# Patient Record
Sex: Female | Born: 1975 | Race: White | Hispanic: No | Marital: Married | Smoking: Never smoker
Health system: Southern US, Community
[De-identification: ages and names within clinical notes are randomized; demographics above are authoritative.]

## PROBLEM LIST (undated history)

## (undated) DIAGNOSIS — R569 Unspecified convulsions: Secondary | ICD-10-CM

## (undated) HISTORY — PX: TUBAL LIGATION: SHX77

## (undated) HISTORY — PX: CHOLECYSTECTOMY: SHX55

---

## 1996-05-21 DIAGNOSIS — R569 Unspecified convulsions: Secondary | ICD-10-CM

## 1996-05-21 HISTORY — DX: Unspecified convulsions: R56.9

## 2004-10-19 HISTORY — PX: FOOT TUMOR EXCISION: SUR566

## 2004-11-09 ENCOUNTER — Ambulatory Visit (HOSPITAL_BASED_OUTPATIENT_CLINIC_OR_DEPARTMENT_OTHER): Admission: RE | Admit: 2004-11-09 | Discharge: 2004-11-09 | Payer: Self-pay | Admitting: General Surgery

## 2004-11-09 ENCOUNTER — Ambulatory Visit (HOSPITAL_COMMUNITY): Admission: RE | Admit: 2004-11-09 | Discharge: 2004-11-09 | Payer: Self-pay | Admitting: General Surgery

## 2005-02-27 ENCOUNTER — Encounter: Admission: RE | Admit: 2005-02-27 | Discharge: 2005-02-27 | Payer: Self-pay | Admitting: Obstetrics and Gynecology

## 2005-04-17 ENCOUNTER — Inpatient Hospital Stay (HOSPITAL_COMMUNITY): Admission: AD | Admit: 2005-04-17 | Discharge: 2005-04-20 | Payer: Self-pay | Admitting: Obstetrics and Gynecology

## 2010-06-10 ENCOUNTER — Encounter: Payer: Self-pay | Admitting: Obstetrics and Gynecology

## 2010-06-12 ENCOUNTER — Other Ambulatory Visit: Payer: Self-pay | Admitting: Obstetrics and Gynecology

## 2011-03-19 LAB — OB RESULTS CONSOLE HIV ANTIBODY (ROUTINE TESTING): HIV: NONREACTIVE

## 2011-03-19 LAB — OB RESULTS CONSOLE ABO/RH: RH Type: POSITIVE

## 2011-03-19 LAB — OB RESULTS CONSOLE ANTIBODY SCREEN: Antibody Screen: NEGATIVE

## 2011-05-31 ENCOUNTER — Encounter: Payer: 59 | Attending: Obstetrics and Gynecology | Admitting: Dietician

## 2011-05-31 ENCOUNTER — Encounter: Payer: Self-pay | Admitting: Dietician

## 2011-05-31 DIAGNOSIS — Z713 Dietary counseling and surveillance: Secondary | ICD-10-CM | POA: Insufficient documentation

## 2011-05-31 DIAGNOSIS — O99810 Abnormal glucose complicating pregnancy: Secondary | ICD-10-CM | POA: Insufficient documentation

## 2011-05-31 NOTE — Patient Instructions (Signed)
Goals:  Check glucose levels per MD as instructed  Follow Gestational Diabetes Diet as instructed  Call for follow-up as needed    

## 2011-05-31 NOTE — Progress Notes (Signed)
  Patient was seen on 05/31/2011 for Gestational Diabetes self-management class at the [facility]. The following learning objectives were met by the patient during this course:   States the definition of Gestational Diabetes  States why dietary management is important in controlling blood glucose  Describes the effects each nutrient has on blood glucose levels  Demonstrates ability to create a balanced meal plan  Demonstrates carbohydrate counting   States when to check blood glucose levels  Demonstrates proper blood glucose monitoring techniques  States the effect of stress and exercise on blood glucose levels  States the importance of limiting caffeine and abstaining from alcohol and smoking  Blood glucose monitor given: One Touch Ultra Mini Meter Lot # [id number] X Exp: 09/2011 Blood glucose reading: 115 mg/dl 3 hours after lunch  Patient instructed to monitor glucose levels:Fasting and 2 hours after the first bite of each meal FBS: 60 - <90 2 hour: <120  Patient received handouts:  Nutrition Diabetes and Pregnancy  Carbohydrate Counting List  Patient will be seen for follow-up as needed.

## 2011-08-31 ENCOUNTER — Inpatient Hospital Stay (HOSPITAL_COMMUNITY): Admission: AD | Admit: 2011-08-31 | Payer: Self-pay | Source: Ambulatory Visit | Admitting: Obstetrics and Gynecology

## 2011-10-16 ENCOUNTER — Other Ambulatory Visit: Payer: Self-pay | Admitting: Obstetrics and Gynecology

## 2011-10-17 ENCOUNTER — Encounter (HOSPITAL_COMMUNITY): Payer: Self-pay | Admitting: Pharmacist

## 2011-10-18 ENCOUNTER — Encounter (HOSPITAL_COMMUNITY)
Admission: RE | Admit: 2011-10-18 | Discharge: 2011-10-18 | Disposition: A | Discharge status: Home / Self Care | Payer: 59 | Source: Ambulatory Visit | Attending: Obstetrics and Gynecology | Admitting: Obstetrics and Gynecology

## 2011-10-18 ENCOUNTER — Encounter (HOSPITAL_COMMUNITY): Payer: Self-pay

## 2011-10-18 HISTORY — DX: Unspecified convulsions: R56.9

## 2011-10-18 LAB — BASIC METABOLIC PANEL
BUN: 10 mg/dL (ref 6–23)
CO2: 24 mEq/L (ref 19–32)
Calcium: 8.3 mg/dL — ABNORMAL LOW (ref 8.4–10.5)
Creatinine, Ser: 0.79 mg/dL (ref 0.50–1.10)
GFR calc Af Amer: >90 mL/min (ref >90)
GFR calc non Af Amer: >90 mL/min (ref >90)
Glucose, Bld: 123 mg/dL — ABNORMAL HIGH (ref 70–99)
Sodium: 133 mEq/L — ABNORMAL LOW (ref 135–145)

## 2011-10-18 LAB — CBC
Hemoglobin: 11.6 g/dL — ABNORMAL LOW (ref 12.0–15.0)
MCH: 26.2 pg (ref 26.0–34.0)
MCHC: 31.8 g/dL (ref 30.0–36.0)
MCV: 82.4 fL (ref 78.0–100.0)
Platelets: 211 10*3/uL (ref 150–400)
RBC: 4.43 MIL/uL (ref 3.87–5.11)
RDW: 13.7 % (ref 11.5–15.5)
WBC: 11.3 10*3/uL — ABNORMAL HIGH (ref 4.0–10.5)

## 2011-10-18 LAB — SURGICAL PCR SCREEN
MRSA, PCR: NEGATIVE
Staphylococcus aureus: NEGATIVE

## 2011-10-18 LAB — RPR: RPR Ser Ql: NONREACTIVE

## 2011-10-18 NOTE — Patient Instructions (Addendum)
09/3118 Dilara Navarrete Shipp  10/18/2011   Your procedure is scheduled on:  5/31  Enter through the Main Entrance of [facility] at 1130 AM.  Pick up the phone at the desk and dial [phone].   Call this number if you have problems the morning of surgery: [phone]   Remember:   Do not eat food:After Midnight.  Do not drink clear liquids: after 9AM.  Take these medicines the morning of surgery with A SIP OF WATER: Dilantin   Do not wear jewelry, make-up or nail polish.  Do not wear lotions, powders, or perfumes. You may wear deodorant.  Do not shave 48 hours prior to surgery.  Do not bring valuables to the hospital.  Contacts, dentures or bridgework may not be worn into surgery.  Leave suitcase in the car. After surgery it may be brought to your room.  For patients admitted to the hospital, checkout time is 11:00 AM the day of discharge.   Patients discharged the day of surgery will not be allowed to drive home.  Name and phone number of your driver: NA  Special Instructions: CHG Shower Use Special Wash: 1/2 bottle night before surgery and 1/2 bottle morning of surgery.   Please read over the following fact sheets that you were given: MRSA Information

## 2011-10-19 ENCOUNTER — Encounter (HOSPITAL_COMMUNITY): Payer: Self-pay | Admitting: Anesthesiology

## 2011-10-19 ENCOUNTER — Inpatient Hospital Stay (HOSPITAL_COMMUNITY)
Admission: RE | Admit: 2011-10-19 | Discharge: 2011-10-22 | DRG: 765 | Disposition: A | Discharge status: Home / Self Care | Payer: 59 | Source: Ambulatory Visit | Attending: Obstetrics and Gynecology | Admitting: Obstetrics and Gynecology

## 2011-10-19 ENCOUNTER — Inpatient Hospital Stay (HOSPITAL_COMMUNITY): Payer: 59 | Admitting: Anesthesiology

## 2011-10-19 ENCOUNTER — Encounter (HOSPITAL_COMMUNITY)
Admission: RE | Disposition: A | Discharge status: Home / Self Care | Payer: Self-pay | Source: Ambulatory Visit | Attending: Obstetrics and Gynecology

## 2011-10-19 DIAGNOSIS — G40909 Epilepsy, unspecified, not intractable, without status epilepticus: Secondary | ICD-10-CM | POA: Diagnosis present

## 2011-10-19 DIAGNOSIS — O9903 Anemia complicating the puerperium: Secondary | ICD-10-CM | POA: Diagnosis not present

## 2011-10-19 DIAGNOSIS — O99814 Abnormal glucose complicating childbirth: Secondary | ICD-10-CM | POA: Diagnosis present

## 2011-10-19 DIAGNOSIS — D62 Acute posthemorrhagic anemia: Secondary | ICD-10-CM | POA: Diagnosis not present

## 2011-10-19 DIAGNOSIS — O321XX0 Maternal care for breech presentation, not applicable or unspecified: Principal | ICD-10-CM | POA: Diagnosis present

## 2011-10-19 DIAGNOSIS — O99354 Diseases of the nervous system complicating childbirth: Secondary | ICD-10-CM | POA: Diagnosis present

## 2011-10-19 DIAGNOSIS — O3660X0 Maternal care for excessive fetal growth, unspecified trimester, not applicable or unspecified: Secondary | ICD-10-CM | POA: Diagnosis present

## 2011-10-19 DIAGNOSIS — Z302 Encounter for sterilization: Secondary | ICD-10-CM

## 2011-10-19 SURGERY — Surgical Case
Anesthesia: Spinal | Laterality: Bilateral | Wound class: Clean Contaminated

## 2011-10-19 MED ORDER — MEPERIDINE HCL 25 MG/ML IJ SOLN: 6.2500 mg | INTRAMUSCULAR | Status: DC | PRN

## 2011-10-19 MED ORDER — METOCLOPRAMIDE HCL 5 MG/ML IJ SOLN: 10.0000 mg | Freq: Three times a day (TID) | INTRAMUSCULAR | Status: DC | PRN

## 2011-10-19 MED ORDER — OXYTOCIN 10 UNIT/ML IJ SOLN
INTRAMUSCULAR | Status: AC
  Filled 2011-10-19: qty 2

## 2011-10-19 MED ORDER — FOLIC ACID 1 MG PO TABS
4.0000 mg | ORAL_TABLET | Freq: Every day | ORAL | Status: DC
  Filled 2011-10-19 (×4): qty 4

## 2011-10-19 MED ORDER — IBUPROFEN 600 MG PO TABS: 600.0000 mg | ORAL_TABLET | Freq: Four times a day (QID) | ORAL | Status: DC | PRN

## 2011-10-19 MED ORDER — PRENATAL MULTIVITAMIN CH
1.0000 | ORAL_TABLET | Freq: Every day | ORAL | Status: DC
  Filled 2011-10-19 (×3): qty 1

## 2011-10-19 MED ORDER — ONDANSETRON HCL 4 MG PO TABS: 4.0000 mg | ORAL_TABLET | ORAL | Status: DC | PRN

## 2011-10-19 MED ORDER — BUPIVACAINE HCL (PF) 0.25 % IJ SOLN
INTRAMUSCULAR | Status: DC | PRN
  Administered 2011-10-19: 7 mL

## 2011-10-19 MED ORDER — SODIUM CHLORIDE 0.9 % IV SOLN: 250.0000 mL | INTRAVENOUS | Status: DC

## 2011-10-19 MED ORDER — SODIUM CHLORIDE 0.9 % IJ SOLN: 3.0000 mL | INTRAMUSCULAR | Status: DC | PRN

## 2011-10-19 MED ORDER — NALBUPHINE HCL 10 MG/ML IJ SOLN
5.0000 mg | INTRAMUSCULAR | Status: DC | PRN
  Administered 2011-10-20: 10 mg via INTRAVENOUS
  Filled 2011-10-19: qty 1

## 2011-10-19 MED ORDER — FENTANYL CITRATE 0.05 MG/ML IJ SOLN: 25.0000 ug | INTRAMUSCULAR | Status: DC | PRN

## 2011-10-19 MED ORDER — BUPIVACAINE IN DEXTROSE 0.75-8.25 % IT SOLN
INTRATHECAL | Status: DC | PRN
  Administered 2011-10-19: 1.6 mL via INTRATHECAL

## 2011-10-19 MED ORDER — EPHEDRINE SULFATE 50 MG/ML IJ SOLN
INTRAMUSCULAR | Status: DC | PRN
  Administered 2011-10-19 (×2): 10 mg via INTRAVENOUS

## 2011-10-19 MED ORDER — PHENYTOIN SODIUM EXTENDED 100 MG PO CAPS
400.0000 mg | ORAL_CAPSULE | Freq: Every day | ORAL | Status: DC
  Filled 2011-10-19 (×3): qty 4

## 2011-10-19 MED ORDER — METHYLERGONOVINE MALEATE 0.2 MG PO TABS: 0.2000 mg | ORAL_TABLET | ORAL | Status: DC | PRN

## 2011-10-19 MED ORDER — DIPHENHYDRAMINE HCL 25 MG PO CAPS
25.0000 mg | ORAL_CAPSULE | ORAL | Status: DC | PRN
  Filled 2011-10-19: qty 1

## 2011-10-19 MED ORDER — FLEET ENEMA 7-19 GM/118ML RE ENEM: 1.0000 | ENEMA | Freq: Every day | RECTAL | Status: DC | PRN

## 2011-10-19 MED ORDER — FENTANYL CITRATE 0.05 MG/ML IJ SOLN
INTRAMUSCULAR | Status: DC | PRN
  Administered 2011-10-19: 25 ug via INTRATHECAL

## 2011-10-19 MED ORDER — KETOROLAC TROMETHAMINE 30 MG/ML IJ SOLN
30.0000 mg | Freq: Four times a day (QID) | INTRAMUSCULAR | Status: DC | PRN
  Filled 2011-10-19: qty 1

## 2011-10-19 MED ORDER — LACTATED RINGERS IV SOLN
INTRAVENOUS | Status: DC
  Administered 2011-10-19 (×4): via INTRAVENOUS

## 2011-10-19 MED ORDER — MORPHINE SULFATE (PF) 0.5 MG/ML IJ SOLN
INTRAMUSCULAR | Status: DC | PRN
  Administered 2011-10-19: .15 mg via INTRATHECAL

## 2011-10-19 MED ORDER — CEFAZOLIN SODIUM-DEXTROSE 2-3 GM-% IV SOLR
2.0000 g | INTRAVENOUS | Status: AC
  Administered 2011-10-19: 2 g via INTRAVENOUS
  Filled 2011-10-19: qty 50

## 2011-10-19 MED ORDER — KETOROLAC TROMETHAMINE 30 MG/ML IJ SOLN
INTRAMUSCULAR | Status: AC
  Filled 2011-10-19: qty 1

## 2011-10-19 MED ORDER — SODIUM CHLORIDE 0.9 % IV SOLN: 1.0000 ug/kg/h | INTRAVENOUS | Status: DC | PRN

## 2011-10-19 MED ORDER — OXYCODONE-ACETAMINOPHEN 5-325 MG PO TABS
1.0000 | ORAL_TABLET | ORAL | Status: DC | PRN
  Administered 2011-10-20 – 2011-10-21 (×3): 1 via ORAL
  Filled 2011-10-19 (×3): qty 1

## 2011-10-19 MED ORDER — MEASLES, MUMPS & RUBELLA VAC ~~LOC~~ INJ: 0.5000 mL | INJECTION | Freq: Once | SUBCUTANEOUS | Status: DC

## 2011-10-19 MED ORDER — 0.9 % SODIUM CHLORIDE (POUR BTL) OPTIME
TOPICAL | Status: DC | PRN
  Administered 2011-10-19: 1000 mL

## 2011-10-19 MED ORDER — NALBUPHINE HCL 10 MG/ML IJ SOLN: 5.0000 mg | INTRAMUSCULAR | Status: DC | PRN

## 2011-10-19 MED ORDER — MORPHINE SULFATE 0.5 MG/ML IJ SOLN
INTRAMUSCULAR | Status: AC
  Filled 2011-10-19: qty 10

## 2011-10-19 MED ORDER — KETOROLAC TROMETHAMINE 30 MG/ML IJ SOLN
30.0000 mg | Freq: Four times a day (QID) | INTRAMUSCULAR | Status: DC | PRN
  Administered 2011-10-19 (×2): 30 mg via INTRAVENOUS

## 2011-10-19 MED ORDER — DIPHENHYDRAMINE HCL 50 MG/ML IJ SOLN: 25.0000 mg | INTRAMUSCULAR | Status: DC | PRN

## 2011-10-19 MED ORDER — MENTHOL 3 MG MT LOZG: 1.0000 | LOZENGE | OROMUCOSAL | Status: DC | PRN

## 2011-10-19 MED ORDER — ONDANSETRON HCL 4 MG/2ML IJ SOLN: 4.0000 mg | INTRAMUSCULAR | Status: DC | PRN

## 2011-10-19 MED ORDER — DIBUCAINE 1 % RE OINT: 1.0000 "application " | TOPICAL_OINTMENT | RECTAL | Status: DC | PRN

## 2011-10-19 MED ORDER — FENTANYL CITRATE 0.05 MG/ML IJ SOLN
INTRAMUSCULAR | Status: AC
  Filled 2011-10-19: qty 2

## 2011-10-19 MED ORDER — SODIUM CHLORIDE 0.9 % IJ SOLN: 3.0000 mL | Freq: Two times a day (BID) | INTRAMUSCULAR | Status: DC

## 2011-10-19 MED ORDER — OXYTOCIN 10 UNIT/ML IJ SOLN
40.0000 [IU] | INTRAVENOUS | Status: DC | PRN
  Administered 2011-10-19: 40 [IU] via INTRAVENOUS

## 2011-10-19 MED ORDER — SIMETHICONE 80 MG PO CHEW
80.0000 mg | CHEWABLE_TABLET | Freq: Three times a day (TID) | ORAL | Status: DC
  Administered 2011-10-19 – 2011-10-22 (×10): 80 mg via ORAL

## 2011-10-19 MED ORDER — SCOPOLAMINE 1 MG/3DAYS TD PT72
1.0000 | MEDICATED_PATCH | TRANSDERMAL | Status: DC
  Administered 2011-10-19: 1.5 mg via TRANSDERMAL

## 2011-10-19 MED ORDER — LANOLIN HYDROUS EX OINT: 1.0000 "application " | TOPICAL_OINTMENT | CUTANEOUS | Status: DC | PRN

## 2011-10-19 MED ORDER — WITCH HAZEL-GLYCERIN EX PADS: 1.0000 "application " | MEDICATED_PAD | CUTANEOUS | Status: DC | PRN

## 2011-10-19 MED ORDER — MEDROXYPROGESTERONE ACETATE 150 MG/ML IM SUSP: 150.0000 mg | INTRAMUSCULAR | Status: DC | PRN

## 2011-10-19 MED ORDER — EPHEDRINE 5 MG/ML INJ
INTRAVENOUS | Status: AC
  Filled 2011-10-19: qty 10

## 2011-10-19 MED ORDER — NALOXONE HCL 0.4 MG/ML IJ SOLN: 0.4000 mg | INTRAMUSCULAR | Status: DC | PRN

## 2011-10-19 MED ORDER — BUPIVACAINE HCL (PF) 0.25 % IJ SOLN
INTRAMUSCULAR | Status: AC
  Filled 2011-10-19: qty 30

## 2011-10-19 MED ORDER — TETANUS-DIPHTH-ACELL PERTUSSIS 5-2.5-18.5 LF-MCG/0.5 IM SUSP: 0.5000 mL | Freq: Once | INTRAMUSCULAR | Status: DC

## 2011-10-19 MED ORDER — IBUPROFEN 600 MG PO TABS
600.0000 mg | ORAL_TABLET | Freq: Four times a day (QID) | ORAL | Status: DC
  Administered 2011-10-20 (×2): 600 mg via ORAL
  Filled 2011-10-19 (×2): qty 1

## 2011-10-19 MED ORDER — ONDANSETRON HCL 4 MG/2ML IJ SOLN
INTRAMUSCULAR | Status: AC
  Filled 2011-10-19: qty 2

## 2011-10-19 MED ORDER — SENNOSIDES-DOCUSATE SODIUM 8.6-50 MG PO TABS
2.0000 | ORAL_TABLET | Freq: Every day | ORAL | Status: DC
  Administered 2011-10-20 – 2011-10-21 (×2): 2 via ORAL

## 2011-10-19 MED ORDER — METHYLERGONOVINE MALEATE 0.2 MG/ML IJ SOLN: 0.2000 mg | INTRAMUSCULAR | Status: DC | PRN

## 2011-10-19 MED ORDER — CLINDAMYCIN PHOSPHATE 1 % EX GEL: Freq: Two times a day (BID) | CUTANEOUS | Status: DC

## 2011-10-19 MED ORDER — OXYTOCIN 20 UNITS IN LACTATED RINGERS INFUSION - SIMPLE
INTRAVENOUS | Status: AC
  Administered 2011-10-19: 125 mL/h via INTRAVENOUS
  Filled 2011-10-19: qty 1000

## 2011-10-19 MED ORDER — SCOPOLAMINE 1 MG/3DAYS TD PT72
MEDICATED_PATCH | TRANSDERMAL | Status: AC
  Administered 2011-10-19: 1.5 mg via TRANSDERMAL
  Filled 2011-10-19: qty 1

## 2011-10-19 MED ORDER — DIPHENHYDRAMINE HCL 25 MG PO CAPS
25.0000 mg | ORAL_CAPSULE | Freq: Four times a day (QID) | ORAL | Status: DC | PRN
  Administered 2011-10-19: 25 mg via ORAL

## 2011-10-19 MED ORDER — CEFAZOLIN SODIUM 1-5 GM-% IV SOLN
1.0000 g | Freq: Three times a day (TID) | INTRAVENOUS | Status: AC
  Administered 2011-10-19 – 2011-10-20 (×2): 1 g via INTRAVENOUS
  Filled 2011-10-19 (×2): qty 50

## 2011-10-19 MED ORDER — SIMETHICONE 80 MG PO CHEW: 80.0000 mg | CHEWABLE_TABLET | ORAL | Status: DC | PRN

## 2011-10-19 MED ORDER — BISACODYL 10 MG RE SUPP: 10.0000 mg | Freq: Every day | RECTAL | Status: DC | PRN

## 2011-10-19 MED ORDER — ONDANSETRON HCL 4 MG/2ML IJ SOLN: 4.0000 mg | Freq: Three times a day (TID) | INTRAMUSCULAR | Status: DC | PRN

## 2011-10-19 MED ORDER — OXYTOCIN 20 UNITS IN LACTATED RINGERS INFUSION - SIMPLE
125.0000 mL/h | INTRAVENOUS | Status: AC
  Administered 2011-10-19 (×2): 125 mL/h via INTRAVENOUS
  Filled 2011-10-19: qty 1000

## 2011-10-19 MED ORDER — DIPHENHYDRAMINE HCL 50 MG/ML IJ SOLN: 12.5000 mg | INTRAMUSCULAR | Status: DC | PRN

## 2011-10-19 MED ORDER — ZOLPIDEM TARTRATE 5 MG PO TABS: 5.0000 mg | ORAL_TABLET | Freq: Every evening | ORAL | Status: DC | PRN

## 2011-10-19 SURGICAL SUPPLY — 39 items
BENZOIN TINCTURE PRP APPL 2/3 (GAUZE/BANDAGES/DRESSINGS) IMPLANT
CLOTH BEACON ORANGE TIMEOUT ST (SAFETY) ×2 IMPLANT
CONTAINER PREFILL 10% NBF 15ML (MISCELLANEOUS) ×4 IMPLANT
DRESSING TELFA 8X3 (GAUZE/BANDAGES/DRESSINGS) ×2 IMPLANT
ELECT REM PT RETURN 9FT ADLT (ELECTROSURGICAL) ×2
ELECTRODE REM PT RTRN 9FT ADLT (ELECTROSURGICAL) ×1 IMPLANT
EXTRACTOR VACUUM KIWI (MISCELLANEOUS) IMPLANT
EXTRACTOR VACUUM M CUP 4 TUBE (SUCTIONS) IMPLANT
GAUZE SPONGE 4X4 12PLY STRL LF (GAUZE/BANDAGES/DRESSINGS) ×4 IMPLANT
GLOVE BIO SURGEON STRL SZ 6.5 (GLOVE) ×2 IMPLANT
GLOVE BIOGEL PI IND STRL 7.0 (GLOVE) ×2 IMPLANT
GLOVE BIOGEL PI INDICATOR 7.0 (GLOVE) ×2
GOWN PREVENTION PLUS LG XLONG (DISPOSABLE) ×6 IMPLANT
KIT ABG SYR 3ML LUER SLIP (SYRINGE) IMPLANT
NEEDLE HYPO 25X1 1.5 SAFETY (NEEDLE) ×2 IMPLANT
NEEDLE HYPO 25X5/8 SAFETYGLIDE (NEEDLE) IMPLANT
NS IRRIG 1000ML POUR BTL (IV SOLUTION) ×2 IMPLANT
PACK C SECTION WH (CUSTOM PROCEDURE TRAY) ×2 IMPLANT
PAD ABD 7.5X8 STRL (GAUZE/BANDAGES/DRESSINGS) ×2 IMPLANT
RTRCTR C-SECT PINK 25CM LRG (MISCELLANEOUS) IMPLANT
SLEEVE SCD COMPRESS KNEE MED (MISCELLANEOUS) IMPLANT
STAPLER VISISTAT 35W (STAPLE) IMPLANT
STRIP CLOSURE SKIN 1/2X4 (GAUZE/BANDAGES/DRESSINGS) IMPLANT
SUT CHROMIC GUT AB #0 18 (SUTURE) ×2 IMPLANT
SUT MNCRL 0 VIOLET CTX 36 (SUTURE) ×3 IMPLANT
SUT MON AB 4-0 PS1 27 (SUTURE) IMPLANT
SUT MONOCRYL 0 CTX 36 (SUTURE) ×3
SUT PLAIN 2 0 (SUTURE)
SUT PLAIN ABS 2-0 CT1 27XMFL (SUTURE) IMPLANT
SUT VIC AB 0 CT1 27 (SUTURE) ×2
SUT VIC AB 0 CT1 27XBRD ANBCTR (SUTURE) ×2 IMPLANT
SUT VIC AB 2-0 CT1 27 (SUTURE) ×1
SUT VIC AB 2-0 CT1 TAPERPNT 27 (SUTURE) ×1 IMPLANT
SUT VICRYL 0 TIES 12 18 (SUTURE) IMPLANT
SYR CONTROL 10ML LL (SYRINGE) ×2 IMPLANT
TAPE CLOTH SURG 4X10 WHT LF (GAUZE/BANDAGES/DRESSINGS) ×2 IMPLANT
TOWEL OR 17X24 6PK STRL BLUE (TOWEL DISPOSABLE) ×4 IMPLANT
TRAY FOLEY CATH 14FR (SET/KITS/TRAYS/PACK) ×2 IMPLANT
WATER STERILE IRR 1000ML POUR (IV SOLUTION) ×2 IMPLANT

## 2011-10-19 NOTE — Anesthesia Postprocedure Evaluation (Signed)
Anesthesia Post Note  Patient: Isabella Mcintyre  Procedure(s) Performed: Procedure(s) (LRB): CESAREAN SECTION WITH BILATERAL TUBAL LIGATION (Bilateral)  Anesthesia type: Spinal  Patient location: PACU  Post pain: Pain level controlled  Post assessment: Post-op Vital signs reviewed  Last Vitals:  Filed Vitals:   10/19/11 1430  BP: 118/73  Pulse: 75  Temp:   Resp: 18    Post vital signs: Reviewed  Level of consciousness: awake  Complications: No apparent anesthesia complications

## 2011-10-19 NOTE — Brief Op Note (Signed)
10/19/2011  2:09 PM  PATIENT:  Isabella Mcintyre  36 y.o. female  PRE-OPERATIVE DIAGNOSIS:  Breech, Macrosomia, Term gestation, Class A1 GDM, Seizure disorder, Desires sterilization  POST-OPERATIVE DIAGNOSIS:  Homero Fellers Breech, Term gestation Macrosomia, Class A1  GDM, Seizure disorder, Desires sterilization  PROCEDURE:  Procedure(s) (LRB):PRIMARY CESAREAN SECTION, KERR HYSTEROTOMY BILATERAL MODIFIED TUBAL LIGATION (Bilateral)  SURGEON:  Surgeon(s) and Role:    * Amar Sippel Cathie Beams, MD - Primary  PHYSICIAN ASSISTANT:   ASSISTANTS: Marlinda Mike, CNM  ANESTHESIA:   spinal  FINDINGS: live female frank breech, nl tubes and ovaries, 9lb 4 oz  EBL:  Total I/O In: 2600 [I.V.:2600] Out: 850 [Urine:150; Blood:700]  BLOOD ADMINISTERED:none  DRAINS: none   LOCAL MEDICATIONS USED:  MARCAINE     SPECIMEN:  Source of Specimen:  placenta, portion of right and left tube  DISPOSITION OF SPECIMEN:  PATHOLOGY  COUNTS:  YES  TOURNIQUET:  * No tourniquets in log *  DICTATION: .Other Dictation: Dictation Number [id number]  PLAN OF CARE: Admit to inpatient   PATIENT DISPOSITION:  PACU - hemodynamically stable.   Delay start of Pharmacological VTE agent (>24hrs) due to surgical blood loss or risk of bleeding: no

## 2011-10-19 NOTE — Anesthesia Procedure Notes (Signed)
Spinal  Patient location during procedure: OR Start time: 10/19/2011 1:22 PM Staffing Anesthesiologist: Jerrian Mells A. Performed by: anesthesiologist  Preanesthetic Checklist Completed: patient identified, site marked, surgical consent, pre-op evaluation, timeout performed, IV checked, risks and benefits discussed and monitors and equipment checked Spinal Block Patient position: sitting Prep: site prepped and draped and DuraPrep Patient monitoring: heart rate, cardiac monitor, blood pressure and continuous pulse ox Approach: midline Location: L3-4 Injection technique: single-shot Needle Needle type: Sprotte  Needle gauge: 24 G Needle length: 9 cm Needle insertion depth: 5 cm Assessment Sensory level: T4 Additional Notes Patient tolerated procedure well. Adequate sensory level.

## 2011-10-19 NOTE — OR Nursing (Signed)
10/19/2010 History and physical on paper on the chart per Dr. Cherly Hensen.

## 2011-10-19 NOTE — Transfer of Care (Signed)
Immediate Anesthesia Transfer of Care Note  Patient: Isabella Mcintyre  Procedure(s) Performed: Procedure(s) (LRB): CESAREAN SECTION WITH BILATERAL TUBAL LIGATION (Bilateral)  Patient Location: PACU  Anesthesia Type: Spinal  Level of Consciousness: awake, alert  and oriented  Airway & Oxygen Therapy: Patient Spontanous Breathing  Post-op Assessment: Report given to PACU RN and Post -op Vital signs reviewed and stable  Post vital signs: Reviewed and stable  Complications: No apparent anesthesia complications

## 2011-10-19 NOTE — Op Note (Signed)
Isabella Mcintyre, Isabella Mcintyre             ACCOUNT NO.:  [id number]  MEDICAL RECORD NO.:  [id number]  LOCATION:  9103                          FACILITY:  [facility]  PHYSICIAN:  Maxie Better, M.D.DATE OF BIRTH:  1976/04/02  DATE OF PROCEDURE:  10/19/2011 DATE OF DISCHARGE:                              OPERATIVE REPORT   PREOPERATIVE DIAGNOSES:  Term gestation, class A1 gestational diabetes, frank breech presentation, desires sterilization, seizure disorder.  POSTOPERATIVE DIAGNOSES:  Fetal macrosomia, class A1 gestational diabetes, term gestation, seizure disorder, frank breech presentation,desires sterilization.  PROCEDURE:  Primary cesarean section, Sharl Ma hysterotomy  Modified Pomeroy tubal ligation.  ANESTHESIA:  Spinal.  SURGEON:  Maxie Better, MD  ASSISTANT:  Marlinda Mike, CNM  PROCEDURE:  Under adequate spinal anesthesia, the patient was placed in the supine position with a left lateral tilt.  She was sterilely prepped and draped in usual fashion.  An indwelling Foley catheter was sterilely placed.  Marcaine 0.25% was injected along the planned Pfannenstiel skin incision site.  Pfannenstiel skin incision was then made, carried down to the rectus fascia.  The rectus fascia was opened transversely.  The rectus fascia was then bluntly and sharply dissected off the rectus muscle in superior and inferior fashion.  The rectus muscle was split in midline.  The parietal peritoneum was entered sharply and extended.  A well-developed lower uterine segment was noted.  The vesicouterine peritoneum was opened transversely.  The bladder was bluntly dissected off the lower uterine segment and then displaced inferiorly with a bladder retractor.  A curvilinear low-transverse uterine incision was then made and extended with bandage scissors with subsequent artificial rupture of membranes, clear amniotic fluid noted.  Subsequent delivery of a live female from a frank breech presentation was  accomplished with the usual breech maneuvers.  The baby was bulb suctioned on the abdomen. Cord was clamped and cut.  The baby was transferred to the awaiting pediatrician who assigned Apgars of 9 and 9 at 1 and 5 minutes.  The placenta was manually removed.  Uterine cavity was cleaned of debris. Uterine incision had no extension.  Uterine incision was closed in 2 layers, the first layer with 0 Monocryl running locked stitch, second layer was imbricating using 0 Monocryl suture.  Good hemostasis noted along the incision line.  The abdomen was then copiously irrigated and suctioned of debris.  Both fallopian tubes were noted to be normal as well as the ovaries.  The midportion of the left fallopian tube was grasped with a Babcock.  The underlying mesosalpinx was opened with cautery.  Proximal and distal portion of the tube was tied with 0 chromic sutures proximally and distally with the intervening segment of tube was then removed.  The same procedure was performed on the contralateral side after identifying the fimbriated end.  The midportion of the right fallopian tube was also removed.  With good hemostasis noted, the parietal peritoneum was then closed with 2-0 Vicryl, the rectus fascia was closed with 0 Vicryl x2, the subcutaneous area was irrigated, small bleeder was cauterized, interrupted 2-0 plain suture was placed and the skin was approximated using Ethicon staples. Specimen was placenta sent to pathology due to the maternal history of melanoma,  portion of right and left fallopian tubes sent to pathology. Estimated blood loss was 700 mL.  Intraoperative fluid 2600 mL. Urine output 150 mL, clear yellow urine.  Sponge and instrument counts x2 was correct.  Complication was none.  Weight of the baby was 9 pounds and 4 ounces.  The patient tolerated the procedure well, was transferred to recovery room in stable condition.     Maxie Better, M.D.     Bagnell/MEDQ  D:   10/19/2011  T:  10/19/2011  Job:  [id number]

## 2011-10-19 NOTE — Consult Note (Signed)
Neonatology Note:   Attendance at C-section:    I was asked to attend this primary C/S at term due to breech presentation. The mother is a G2P1 O pos, GBS unknown with an otherwise uncomplicated pregnancy. ROM at delivery, fluid clear. Infant vigorous with good spontaneous cry and tone. Needed only minimal bulb suctioning. Ap 9/9. Lungs clear to ausc in DR. Very superficial 0.5 cm laceration on left buttock, shown to father. To CN to care of Pediatrician.   Deatra James, MD

## 2011-10-19 NOTE — Anesthesia Preprocedure Evaluation (Signed)
Anesthesia Evaluation  Patient identified by MRN, date of birth, ID band Patient awake    Reviewed: Allergy & Precautions, H&P , NPO status , Patient's Chart, lab work & pertinent test results  Airway Mallampati: III TM Distance: >3 FB Neck ROM: Full    Dental No notable dental hx. (+) Teeth Intact   Pulmonary neg pulmonary ROS,  breath sounds clear to auscultation  Pulmonary exam normal       Cardiovascular negative cardio ROS  Rhythm:Regular Rate:Normal     Neuro/Psych Seizures -, Well Controlled,  On Dilantin last dose this am. Last Seizure 10 years ago negative psych ROS   GI/Hepatic negative GI ROS, Neg liver ROS,   Endo/Other  Diabetes mellitus-, Well Controlled, Gestational  Renal/GU negative Renal ROS  negative genitourinary   Musculoskeletal negative musculoskeletal ROS (+)   Abdominal   Peds  Hematology negative hematology ROS (+)   Anesthesia Other Findings   Reproductive/Obstetrics (+) Pregnancy                           Anesthesia Physical Anesthesia Plan  ASA: II  Anesthesia Plan: Spinal   Post-op Pain Management:    Induction:   Airway Management Planned: Natural Airway  Additional Equipment:   Intra-op Plan:   Post-operative Plan:   Informed Consent: I have reviewed the patients History and Physical, chart, labs and discussed the procedure including the risks, benefits and alternatives for the proposed anesthesia with the patient or authorized representative who has indicated his/her understanding and acceptance.   Dental advisory given  Plan Discussed with: CRNA, Anesthesiologist and Surgeon  Anesthesia Plan Comments:         Anesthesia Quick Evaluation

## 2011-10-20 ENCOUNTER — Encounter (HOSPITAL_COMMUNITY): Payer: Self-pay | Admitting: *Deleted

## 2011-10-20 LAB — CBC
HCT: 26.5 % — ABNORMAL LOW (ref 36.0–46.0)
Hemoglobin: 8.8 g/dL — ABNORMAL LOW (ref 12.0–15.0)
MCH: 26.8 pg (ref 26.0–34.0)
MCHC: 33.2 g/dL (ref 30.0–36.0)

## 2011-10-20 MED ORDER — IBUPROFEN 800 MG PO TABS
800.0000 mg | ORAL_TABLET | Freq: Three times a day (TID) | ORAL | Status: DC
Start: 2011-10-20 — End: 2011-10-22
  Administered 2011-10-20 – 2011-10-22 (×6): 800 mg via ORAL
  Filled 2011-10-20 (×6): qty 1

## 2011-10-20 NOTE — Anesthesia Postprocedure Evaluation (Signed)
  Anesthesia Post-op Note  Patient: Isabella Mcintyre  Procedure(s) Performed: Procedure(s) (LRB): CESAREAN SECTION WITH BILATERAL TUBAL LIGATION (Bilateral)  Patient Location: PACU and Mother/Baby  Anesthesia Type: Epidural  Level of Consciousness: awake, alert  and oriented  Airway and Oxygen Therapy: Patient Spontanous Breathing  Post-op Pain: none  Post-op Assessment: Post-op Vital signs reviewed and Patient's Cardiovascular Status Stable  Post-op Vital Signs: Reviewed and stable  Complications: No apparent anesthesia complications

## 2011-10-20 NOTE — Progress Notes (Signed)
Patient ID: Isabella Mcintyre, female   DOB: 12-Nov-1975, 36 y.o.   MRN: [id number]  POSTOPERATIVE DAY # 1 S/P cesarean section and BTL   S:         Reports feeling some soreness - more right side / motrin minimally effective             Tolerating po intake / no nausea / no vomiting / + flatus / no BM             Bleeding is light             Pain controlled with motrin and percocet             Up ad lib / ambulatory  Newborn breast feeding  / female newborn   O:  A & O x 3 NAD              VS: Blood pressure 117/70, pulse 70, temperature 97.8 F (36.6 C), temperature source Oral, resp. rate 18, weight 83.462 kg (184 lb), last menstrual period 01/19/2011, SpO2 95.00%, unknown if currently breastfeeding.   LABS: WBC/Hgb/Hct/Plts:  10.9/8.8/26.5/154 (06/01 0545)    I&O:  + 2300~  Lungs: Clear and unlabored  Heart: regular rate and rhythm / no mumurs  Abdomen: soft, non-tender, non-distended, active BS             Fundus: firm, non-tender, Ueven             Dressing intact without drainage - edge lifted to assess incision site              Incision:  approximated with staples / no erythema / no ecchymosis / no drainage  Perineum: intact / no edema  Lochia: light  Extremities: trace edema, no calf pain or tenderness  A:        POD # 1 S/P CS and BTL            Acute blood loss anemia  P:        Routine postoperative care              Start iron and colace            Adjust motrin dose for better pain control     Marlinda Mike CNM,MSN 10/20/2011, 11:05 AM

## 2011-10-20 NOTE — Addendum Note (Signed)
Addendum  created 10/20/11 1051 by Orlie Pollen, CRNA   Modules edited:Notes Section

## 2011-10-21 NOTE — Progress Notes (Signed)
POSTOPERATIVE DAY # 2 S/P cesarean section   S:         Reports feeling well - better today             Tolerating po intake / no  nausea / no  vomiting / + flatus / no BM             Bleeding is spotting             Pain controlled with motrin and percocet             Up ad lib / ambulatory  Newborn breast feeding     O:  A & O x 3              VS: Blood pressure 122/63, pulse 80, temperature 98.3 F (36.8 C), temperature source Oral, resp. rate 20, weight 83.462 kg (184 lb), last menstrual period 01/19/2011, SpO2 95.00%, unknown if currently breastfeeding.  Lungs: Clear and unlabored  Heart: regular rate and rhythm  Abdomen: soft, non-tender, non-distended, active BS             Fundus: firm, non-tender, U-2             Dressing OFF              Incision:  approximated with staples /  No erythema / no ecchymosis / no drainage  Perineum: no edema  Lochia: spotting / scant amount  Extremities: no edema, no calf pain or tenderness, negative Homans  A:        POD # 2 S/P cesarean section            stable status  P:        Routine postoperative care              Discharge tomorrow     Marlinda Mike CNM,MSN 10/21/2011, 10:17 AM

## 2011-10-22 ENCOUNTER — Encounter (HOSPITAL_COMMUNITY): Payer: Self-pay | Admitting: *Deleted

## 2011-10-22 MED ORDER — POLYSACCHARIDE IRON COMPLEX 150 MG PO CAPS: 150.0000 mg | ORAL_CAPSULE | Freq: Every day | ORAL | Status: DC

## 2011-10-22 MED ORDER — POLYSACCHARIDE IRON COMPLEX 150 MG PO CAPS
150.0000 mg | ORAL_CAPSULE | Freq: Every day | ORAL | Status: DC
  Filled 2011-10-22: qty 1

## 2011-10-22 MED ORDER — HYDROCHLOROTHIAZIDE 12.5 MG PO CAPS: 12.5000 mg | ORAL_CAPSULE | Freq: Every day | ORAL | Status: DC

## 2011-10-22 MED ORDER — OXYCODONE-ACETAMINOPHEN 5-325 MG PO TABS: 1.0000 | ORAL_TABLET | ORAL | Status: AC | PRN

## 2011-10-22 MED ORDER — IBUPROFEN 800 MG PO TABS: 800.0000 mg | ORAL_TABLET | Freq: Three times a day (TID) | ORAL | Status: AC

## 2011-10-22 NOTE — Progress Notes (Signed)
Patient ID: Isabella Mcintyre, female   DOB: [birth year], 36 y.o.   MRN: [id number]   POSTOPERATIVE DAY # 3 S/P cesarean section / BTL   S:         Reports feeling well / ready for discharge             Tolerating po intake / no  nausea / no  vomiting / + flatus / no  BM             Bleeding is light             Pain controlled with motrin and percocet             Up ad lib / ambulatory  Newborn bottle feeding  / female newborn   O:  A & O x 3              VS: Blood pressure 129/81, pulse 76, temperature 98.1 F (36.7 C), temperature source Oral, resp. rate 18, weight 83.462 kg (184 lb), last menstrual period 01/19/2011, SpO2 95.00%, unknown if currently breastfeeding.  Lungs: Clear and unlabored  Heart: regular rate and rhythm  Abdomen: soft, non-tender, non-distended, active BS             Fundus: firm, non-tender, U-1             Dressing OFF              Incision:  approximated with staples / no erythema / no ecchymosis / no drainage  Perineum: no edema  Lochia: light  Extremities: 2+ pedal edema, no calf pain or tenderness  A:        POD # 3 S/P cesarean section with BTL            Dependent edema  P:        Routine postoperative care              Discharge home             HCTZ x 5 days     Marlinda Mike CNM,MSN 10/22/2011, 8:57 AM

## 2011-10-22 NOTE — Discharge Summary (Signed)
POSTOPERATIVE DISCHARGE SUMMARY:  Patient ID: Isabella Mcintyre MRN: [id number] DOB/AGE: 10-13-1975 36 y.o.  Admit date: 10/19/2011 Discharge date:  10/22/2011  Admission Diagnoses: 39 weeks / breech / undesired fertility/class A1 GDM/ seizure disorder/fetal macrosomia    Discharge Diagnoses:  Homero Fellers breech/Class A1GDM/ seizure disorder/desires sterilization/fetal macrosomia  Term Pregnancy-delivered  POD s/p  Primary cesarean section and BTL  Prenatal history: G2P1002   EDC : 10/26/2011, by Last Menstrual Period  Prenatal care at [facility]  Primary provider : Laverta Harnisch Prenatal course complicated by breech, class A1 GDM  Prenatal Labs: ABO, Rh: O (10/29 1416) positive Antibody: Negative (10/29 1416) Rubella: Immune (10/29 1416)  RPR: NON REACTIVE (05/30 1410)  HBsAg: Negative (10/29 1416)  HIV: Non-reactive (10/29 1416)  1 hr Glucola : nl   Medical / Surgical History :  Past medical history:  Past Medical History  Diagnosis Date  . Seizures 1998    last seizure, one of only two in 1998  . Diabetes mellitus     GDM  diet control    Past surgical history:  Past Surgical History  Procedure Date  . Cholecystectomy   . 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  Social History:  reports that she has never smoked. She has never used smokeless tobacco. She reports that she does not drink alcohol or use illicit drugs.   Allergies: Review of patient's allergies indicates no known allergies.    Current Medications at time of admission:  Prior to Admission medications   Medication Sig Start Date End Date Taking? Authorizing Provider  butalbital-acetaminophen-caffeine (FIORICET, ESGIC) 50-325-40 MG per tablet Take 1-2 tablets by mouth every 4 (four) hours as needed. For headache   Yes Historical Provider, MD  clindamycin (CLINDAGEL) 1 % gel Apply topically 2 (two) times daily. For facial acne.    Yes Historical Provider, MD  folic acid (FOLVITE) 1 MG tablet Take 4 mg by mouth daily.   Yes Historical Provider, MD  guaiFENesin (MUCINEX) 600 MG 12 hr tablet Take 600 mg by mouth 2 (two) times daily. For congestion   Yes Historical Provider, MD  phenytoin (DILANTIN) 200 MG ER capsule Take 400 mg by mouth daily.   Yes Historical Provider, MD  Prenatal Vitamins (DIS) TABS Take 1 tablet by mouth daily.   Yes Historical Provider, MD   Procedures: Cesarean section delivery of female newborn by Dr Cherly Hensen  See operative report for further details  Postoperative / postpartum course: uneventful  Physical Exam:   VSS: Blood pressure 129/81, pulse 76, temperature 98.1 F (36.7 C), temperature source Oral, resp. rate 18, weight 83.462 kg (184 lb), last menstrual period 01/19/2011, SpO2 95.00%, unknown if currently breastfeeding.  LABS:   preop hgb 11.6 / postop hgb  8.8 General: pleasant / Calm ./ NAD Heart: RR Lungs: clear Abdomen:active bowel sounds / non-distended Extremities: dependent edema / no evidence of DVT  Incision:  approximated with staples / no erythema / no ecchymosis / no drainage Staples: removed / steri-strips applied  Discharge Instructions:  Discharged Condition: stable Activity: pelvic rest Diet: routine Medications: see below Medication List  As of 10/22/2011  9:04 AM   TAKE these medications         butalbital-acetaminophen-caffeine 50-325-40 MG per tablet   Commonly known as: FIORICET, ESGIC   Take 1-2 tablets by mouth every 4 (four) hours as needed. For headache  clindamycin 1 % gel   Commonly known as: CLINDAGEL   Apply topically 2 (two) times daily. For facial acne.      folic acid 1 MG tablet   Commonly known as: FOLVITE   Take 4 mg by mouth daily.      guaiFENesin 600 MG 12 hr tablet   Commonly known as: MUCINEX   Take 600 mg by mouth 2 (two) times daily. For congestion      hydrochlorothiazide 12.5 MG capsule   Commonly known as: MICROZIDE     Take 1 capsule (12.5 mg total) by mouth daily.      ibuprofen 800 MG tablet   Commonly known as: ADVIL,MOTRIN   Take 1 tablet (800 mg total) by mouth every 8 (eight) hours.      oxyCODONE-acetaminophen 5-325 MG per tablet   Commonly known as: PERCOCET   Take 1-2 tablets by mouth every 3 (three) hours as needed (moderate - severe pain).      phenytoin 200 MG ER capsule   Commonly known as: DILANTIN   Take 400 mg by mouth daily.      Prenatal Vitamins (DIS) Tabs   Take 1 tablet by mouth daily.                      Niferex 150 mg po daily   Condition: stable Postpartum Instructions: refer to practice specific booklet Discharge to: home Disposition: home. Follow up : 8 wk postpartum for 2hr GTT Follow-up Information    Follow up with Manvir Prabhu A, MD. Schedule an appointment as soon as possible for a visit in 6 weeks.   Contact information:   [address] [place] [address] [phone]           Signed: Marlinda Mike CNM,MSN 10/22/2011, 9:04 AM

## 2011-12-26 ENCOUNTER — Other Ambulatory Visit: Payer: Self-pay | Admitting: Dermatology

## 2012-02-05 ENCOUNTER — Ambulatory Visit
Admission: RE | Admit: 2012-02-05 | Discharge: 2012-02-05 | Disposition: A | Discharge status: Home / Self Care | Payer: 59 | Source: Ambulatory Visit | Attending: Orthopedic Surgery | Admitting: Orthopedic Surgery

## 2012-02-05 ENCOUNTER — Other Ambulatory Visit: Payer: Self-pay | Admitting: Orthopedic Surgery

## 2012-02-05 DIAGNOSIS — T148XXA Other injury of unspecified body region, initial encounter: Secondary | ICD-10-CM

## 2012-09-02 ENCOUNTER — Telehealth: Payer: Self-pay

## 2012-09-02 MED ORDER — PHENYTOIN SODIUM EXTENDED 100 MG PO CAPS: 100.0000 mg | ORAL_CAPSULE | Freq: Three times a day (TID) | ORAL | Status: DC

## 2012-09-02 MED ORDER — TOPIRAMATE 100 MG PO TABS: 100.0000 mg | ORAL_TABLET | Freq: Every day | ORAL | Status: DC

## 2012-09-02 NOTE — Telephone Encounter (Signed)
Patient called requesting 90 iday rx be sent to [facility]

## 2012-10-27 ENCOUNTER — Ambulatory Visit: Payer: Self-pay | Admitting: Nurse Practitioner

## 2013-02-10 ENCOUNTER — Ambulatory Visit (INDEPENDENT_AMBULATORY_CARE_PROVIDER_SITE_OTHER): Payer: BC Managed Care – PPO | Admitting: Nurse Practitioner

## 2013-02-10 ENCOUNTER — Ambulatory Visit: Payer: Self-pay | Admitting: Nurse Practitioner

## 2013-02-10 ENCOUNTER — Encounter: Payer: Self-pay | Admitting: Nurse Practitioner

## 2013-02-10 VITALS — BP 109/77 | HR 78 | Ht 64.0 in | Wt 154.0 lb

## 2013-02-10 DIAGNOSIS — Z79899 Other long term (current) drug therapy: Secondary | ICD-10-CM

## 2013-02-10 DIAGNOSIS — Z5181 Encounter for therapeutic drug level monitoring: Secondary | ICD-10-CM | POA: Insufficient documentation

## 2013-02-10 DIAGNOSIS — G43009 Migraine without aura, not intractable, without status migrainosus: Secondary | ICD-10-CM | POA: Insufficient documentation

## 2013-02-10 DIAGNOSIS — G40309 Generalized idiopathic epilepsy and epileptic syndromes, not intractable, without status epilepticus: Secondary | ICD-10-CM

## 2013-02-10 NOTE — Progress Notes (Signed)
[facility]  PATIENT: Isabella Mcintyre DOB: Nov 13, 1975   REASON FOR VISIT: Followup for seizure disorder and migraine   HISTORY OF PRESENT ILLNESS:. Isabella Mcintyre is a 37 year old right-handed white female with a history of migraine headaches and seizures. Patient has not had any severe  headaches recently. The patient is doing well with the seizures, currently taking 300 mg a Dilantin daily  Her last seizure was 1998. Her headches have increased in the last few months, but doing better than when last seen. She was placed on Topamax at her last visit and is currently taking 100 mg daily. She was on Amitriptyline at one time as a preventive but gained weight. She is not interested in restarting that. She takes Fiorcet acutely for headaches. She has never tried triptans. Migraines are worse during her cycle.  REVIEW OF SYSTEMS: Full 14 system review of systems performed and notable only for:  Constitutional: N/A  Cardiovascular: N/A  Ear/Nose/Throat: N/A  Skin: N/A  Eyes: N/A  Respiratory: N/A  Gastroitestinal: N/A  Hematology/Lymphatic: N/A  Endocrine: N/A Musculoskeletal:N/A  Allergy/Immunology: N/A  Neurological: Headache  ALLERGIES: No Known Allergies  HOME MEDICATIONS: Outpatient Prescriptions Prior to Visit  Medication Sig Dispense Refill  . butalbital-acetaminophen-caffeine (FIORICET, ESGIC) 50-325-40 MG per tablet Take 1-2 tablets by mouth every 4 (four) hours as needed. For headache      . phenytoin (DILANTIN) 100 MG ER capsule Take 1 capsule (100 mg total) by mouth 3 (three) times daily.  270 capsule  1  . topiramate (TOPAMAX) 100 MG tablet Take 1 tablet (100 mg total) by mouth at bedtime.  90 tablet  1  . Prenatal Vitamins (DIS) TABS Take 1 tablet by mouth daily.      . clindamycin (CLINDAGEL) 1 % gel Apply topically 2 (two) times daily. For facial acne.      . folic acid (FOLVITE) 1 MG tablet Take 4 mg by mouth daily.      Marland Kitchen guaiFENesin (MUCINEX)  600 MG 12 hr tablet Take 600 mg by mouth 2 (two) times daily. For congestion      . hydrochlorothiazide (MICROZIDE) 12.5 MG capsule Take 1 capsule (12.5 mg total) by mouth daily.  5 capsule  0  . iron polysaccharides (NIFEREX) 150 MG capsule Take 1 capsule (150 mg total) by mouth daily.  30 capsule  0   No facility-administered medications prior to visit.    PAST MEDICAL HISTORY: Past Medical History  Diagnosis Date  . Seizures 1998    last seizure, one of only two in 1998  . Diabetes mellitus     GDM  diet control    PAST SURGICAL HISTORY: Past Surgical History  Procedure Laterality Date  . Cholecystectomy    . 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 No pertinent family history.  SOCIAL HISTORY: History   Social History  . Marital Status: Married    Spouse Name: N/A    Number of Children: 2  . Years of Education: College degree   Occupational History  . Not on file.   Social History Main Topics  . Smoking status: Never Smoker   . Smokeless tobacco: Never Used  . Alcohol Use: No  . Drug Use: No  . Sexual Activity: Not on file   Other Topics Concern  . Working for attorney   Social History Narrative  . No narrative on file     PHYSICAL  EXAM  Filed Vitals:   02/10/13 1352  BP: 109/77  Pulse: 78  Height: 5\' 4"  (1.626 m)  Weight: 154 lb (69.854 kg)   Body mass index is 26.42 kg/(m^2).  Generalized: Well developed, in no acute distress  Head: normocephalic and atraumatic,. Oropharynx benign  Neck: Supple, no carotid bruits  Cardiac: Regular rate rhythm, no murmur  Musculoskeletal: No deformity   Neurological examination   Mentation: Alert oriented to time, place, history taking. Follows all commands speech and language fluent  Cranial nerve II-XII: Pupils were equal round reactive to light extraocular movements were full, visual field were full on confrontational test. Facial sensation and  strength were normal. hearing was intact to finger rubbing bilaterally. Uvula tongue midline. head turning and shoulder shrug and were normal and symmetric.Tongue protrusion into cheek strength was normal. Motor: normal bulk and tone, full strength in the BUE, BLE, fine finger movements normal, no pronator drift. No focal weakness Sensory: normal and symmetric to light touch, pinprick, and  vibration  Coordination: finger-nose-finger, heel-to-shin bilaterally, no dysmetria Reflexes: Brachioradialis 2/2, biceps 2/2, triceps 2/2, patellar 2/2, Achilles 2/2, plantar responses were flexor bilaterally. Gait and Station: Rising up from seated position without assistance, normal stance,  moderate stride, smooth turning, able to perform tiptoe, and heel walking without difficulty.   DIAGNOSTIC DATA (LABS, IMAGING, TESTING) None to review    ASSESSMENT AND PLAN  37 y.o. year old female  has a past medical history of Seizures (1998) and migraines here for followup.   Given samples of Frova to use acutely with co-pay card Continue Topamax current dose Continue Dilantin at current dose Will get labs today, CBC, CMP and dilantin level Nilda Riggs, [facility], [facility], APRN  [facility] [address], [address] [place], [place] [address] [phone]

## 2013-02-10 NOTE — Progress Notes (Signed)
I have read the note, and I agree with the clinical assessment and plan.  WILLIS,CHARLES KEITH   

## 2013-02-10 NOTE — Patient Instructions (Addendum)
Given samples of Frova to use acutely with co-pay card Continue Topamax current dose Continue Dilantin at current dose Will get labs today

## 2013-02-11 LAB — COMPREHENSIVE METABOLIC PANEL
Albumin/Globulin Ratio: 2.1 (ref 1.1–2.5)
Albumin: 4.7 g/dL (ref 3.5–5.5)
Alkaline Phosphatase: 54 IU/L (ref 39–117)
BUN/Creatinine Ratio: 15 (ref 8–20)
BUN: 12 mg/dL (ref 6–20)
CO2: 23 mmol/L (ref 18–29)
Calcium: 8.8 mg/dL (ref 8.7–10.2)
Chloride: 105 mmol/L (ref 96–108)
GFR calc Af Amer: 109 mL/min/{1.73_m2} (ref >59)
GFR calc non Af Amer: 94 mL/min/{1.73_m2} (ref >59)
Globulin, Total: 2.2 g/dL (ref 1.5–4.5)
Glucose: 94 mg/dL (ref 65–99)
Potassium: 4.5 mmol/L (ref 3.5–5.2)
Total Bilirubin: 0.2 mg/dL (ref 0.0–1.2)
Total Protein: 6.9 g/dL (ref 6.0–8.5)

## 2013-02-11 LAB — CBC WITH DIFFERENTIAL/PLATELET
Basos: 0 %
Eosinophils Absolute: 0.2 10*3/uL (ref 0.0–0.4)
HCT: 41.3 % (ref 34.0–46.6)
Lymphocytes Absolute: 2.9 10*3/uL (ref 0.7–3.1)
MCH: 30.6 pg (ref 26.6–33.0)
MCV: 88 fL (ref 79–97)
Monocytes Absolute: 0.7 10*3/uL (ref 0.1–0.9)
Neutrophils Relative %: 58 %
RBC: 4.71 x10E6/uL (ref 3.77–5.28)
RDW: 13.2 % (ref 12.3–15.4)

## 2013-02-11 LAB — PHENYTOIN LEVEL, TOTAL: Phenytoin Lvl: 14.5 ug/mL (ref 10.0–20.0)

## 2013-02-12 ENCOUNTER — Telehealth: Payer: Self-pay

## 2013-02-12 NOTE — Telephone Encounter (Signed)
Message copied by [facility] on Thu Feb 12, 2013 10:52 AM ------      Message from: Beverely Low      Created: Wed Feb 11, 2013 12:34 PM       Labs look good. Please call patient            ----- Message -----         From: [facility] Lab Results In Interface         Sent: 02/11/2013   5:47 AM           To: Nilda Riggs, NP                   ------

## 2013-02-12 NOTE — Telephone Encounter (Signed)
I called patient and left a VM that labs look good. Please call with any questions.

## 2013-03-08 ENCOUNTER — Other Ambulatory Visit: Payer: Self-pay | Admitting: Neurology

## 2013-06-08 ENCOUNTER — Other Ambulatory Visit: Payer: Self-pay | Admitting: Neurology

## 2014-01-18 ENCOUNTER — Telehealth: Payer: Self-pay | Admitting: Nurse Practitioner

## 2014-01-18 MED ORDER — PHENYTOIN SODIUM EXTENDED 100 MG PO CAPS: ORAL_CAPSULE | ORAL | Status: DC

## 2014-01-18 NOTE — Telephone Encounter (Signed)
Rx has been sent  

## 2014-01-18 NOTE — Telephone Encounter (Signed)
Patient called stating she needs a refill on Dilantin. Patient has an appt. in Dec. to see Darrol Angel.

## 2014-01-19 ENCOUNTER — Other Ambulatory Visit: Payer: Self-pay | Admitting: Neurology

## 2014-01-20 NOTE — Telephone Encounter (Signed)
Rx signed and faxed.

## 2014-03-22 ENCOUNTER — Encounter: Payer: Self-pay | Admitting: Nurse Practitioner

## 2014-04-22 ENCOUNTER — Encounter: Payer: Self-pay | Admitting: Nurse Practitioner

## 2014-04-23 ENCOUNTER — Telehealth: Payer: Self-pay | Admitting: Nurse Practitioner

## 2014-04-23 DIAGNOSIS — G43009 Migraine without aura, not intractable, without status migrainosus: Secondary | ICD-10-CM

## 2014-04-23 MED ORDER — SUMATRIPTAN SUCCINATE 50 MG PO TABS
ORAL_TABLET | ORAL | Status: DC
Start: 2014-04-23 — End: 2014-04-26

## 2014-04-23 NOTE — Telephone Encounter (Signed)
Pt called in for migraine. Pt stated that she started to have migraine headache since yesterday. Took fioricet yesterday got a little better but at night getting worse, took one oxycodone and then this morning still HA took another fioricet and still not relieved so took another one around lunch time. Pt stated that this is her typical migraine and she is going to throw up.   She was seen in clinic last year and was given frova sample of 2 tablets. She used it last year but not able to refill the medication due to cost. Not remember whether frova helped her for HA. She is on fioricet now for abortive therapy. Never tried tylenol or NSAIDs in the past. She is also on topamax 100mg  daily at bedtime.  Recommend pt to take tylenol or ibuprofen or naproxen for abortive therapy now. I will prescribe her imitrex for abortive therapy at the early stage of the migraine. Also recommend her to take topamax 50mg  bid instead of 100mg  once a day. The dose of topamax can be titrate up to 100mg  bid depends on her response. Pt expressed understanding and appreciation.  Marvel PlanJindong Kalese Ensz, MD PhD Stroke Neurology 04/23/2014 2:10 PM  Meds ordered this encounter  Medications  . SUMAtriptan (IMITREX) 50 MG tablet    Sig: Take one tablet at the early stage of migraine headache. May repeat 1 tablet in 2 hours if headache persists or recurs. No more than 2 tablets in one day or no more than 5 tablets in one week.    Dispense:  10 tablet    Refill:  0

## 2014-04-23 NOTE — Telephone Encounter (Signed)
Patient stated Rx butalbital-acetaminophen-caffeine (FIORICET, ESGIC) 50-325-40 MG per tablet didn't help with Migraine last night.  She took Hydrocodone to help.  Questioning if she could [facility]  Please call and advise.

## 2014-04-26 ENCOUNTER — Other Ambulatory Visit: Payer: Self-pay

## 2014-04-26 ENCOUNTER — Telehealth: Payer: Self-pay | Admitting: Neurology

## 2014-04-26 MED ORDER — SUMATRIPTAN SUCCINATE 50 MG PO TABS: ORAL_TABLET | ORAL | Status: DC

## 2014-04-26 NOTE — Telephone Encounter (Signed)
[facility] patient request.  SUMAtriptan (IMITREX) 50 MG tablet 10 tablet 0 04/26/2014      Sig: Take 1 tab at onset of migraine. May repeat in 2 hours if needed. Do not exceed 2 tabs in 1 day or 5 tablets in 1 week.    E-Prescribing Status: Receipt confirmed by pharmacy (04/26/2014 8:55 AM EST)     Pharmacy    [id number] [place], [place] - [address]

## 2014-04-26 NOTE — Telephone Encounter (Signed)
Patient spoke with Dr. Roda ShuttersXu last Friday. Dr. Roda ShuttersXu was to call in Imitrex for a migraine for patient but [facility] [place] [facility]. Patient called the pharmacy all weekend. Please call [facility] [place]. If you need to contact patient and cannot reach it is ok to leave a message. Thank you.

## 2014-04-29 ENCOUNTER — Ambulatory Visit: Payer: BC Managed Care – PPO | Admitting: Nurse Practitioner

## 2014-05-26 ENCOUNTER — Ambulatory Visit: Payer: BC Managed Care – PPO | Admitting: Nurse Practitioner

## 2014-06-21 ENCOUNTER — Ambulatory Visit: Payer: BC Managed Care – PPO | Admitting: Nurse Practitioner

## 2014-07-01 ENCOUNTER — Ambulatory Visit (INDEPENDENT_AMBULATORY_CARE_PROVIDER_SITE_OTHER): Payer: BLUE CROSS/BLUE SHIELD | Admitting: Nurse Practitioner

## 2014-07-01 ENCOUNTER — Encounter: Payer: Self-pay | Admitting: Nurse Practitioner

## 2014-07-01 VITALS — BP 116/78 | HR 96 | Ht 64.0 in | Wt 176.2 lb

## 2014-07-01 DIAGNOSIS — Z5181 Encounter for therapeutic drug level monitoring: Secondary | ICD-10-CM

## 2014-07-01 DIAGNOSIS — G43009 Migraine without aura, not intractable, without status migrainosus: Secondary | ICD-10-CM

## 2014-07-01 DIAGNOSIS — G40309 Generalized idiopathic epilepsy and epileptic syndromes, not intractable, without status epilepticus: Secondary | ICD-10-CM

## 2014-07-01 MED ORDER — TOPIRAMATE 100 MG PO TABS: ORAL_TABLET | ORAL | Status: DC

## 2014-07-01 NOTE — Patient Instructions (Addendum)
Continue Dilantin at current dose will refill after labs are back Will obtain CBC, CMP, and Dilantin level Call for any seizure activity Continue Topamax at current dose for migraine, uses Imitrex acutely Call for increase in migraine headaches Follow-up yearly and when necessary  

## 2014-07-01 NOTE — Progress Notes (Signed)
I have read the note, and I agree with the clinical assessment and plan.  Isabella Mcintyre   

## 2014-07-01 NOTE — Progress Notes (Signed)
[facility]  PATIENT: Isabella Mcintyre DOB: 1976/02/06   REASON FOR VISIT: Follow-up for seizure disorder and migraine HISTORY FROM: Patient    HISTORY OF PRESENT ILLNESS:Isabella Mcintyre is a 39 year old right-handed white female with a history of migraine headaches and seizures. She was last seen in this office 02/10/2013. She remains on Topamax for her migraines and has not had any severe headaches recently. The patient is doing well with the seizures, currently taking 300 mg a Dilantin daily Her last seizure was 1998. She was on Amitriptyline at one time as a preventive but gained weight.  She takes Fiorcet acutely for headaches. She has Imitrex to take acutely.  Migraines are worse during her cycle. She returns for reevaluation and labs to monitor side effects of Dilantin.  REVIEW OF SYSTEMS: Full 14 system review of systems performed and notable only for those listed, all others are neg:  Constitutional: neg  Cardiovascular: neg Ear/Nose/Throat: neg  Skin: neg Eyes: neg Respiratory: neg Gastroitestinal: neg  Hematology/Lymphatic: neg  Endocrine: neg Musculoskeletal:neg Allergy/Immunology: neg Neurological: Seizure disorder, history of headaches Psychiatric: neg Sleep : neg   ALLERGIES: No Known Allergies  HOME MEDICATIONS: Outpatient Prescriptions Prior to Visit  Medication Sig Dispense Refill  . Ascorbic Acid (VITAMIN C) 1000 MG tablet Take 1,000 mg by mouth daily.    . butalbital-acetaminophen-caffeine (FIORICET, ESGIC) 50-325-40 MG per tablet TAKE 1 TABLET EVERY 6 HOURS AS NEEDED 30 tablet 0  . cholecalciferol (VITAMIN D) 1000 UNITS tablet Take 1,000 Units by mouth daily.    . phenytoin (DILANTIN) 100 MG ER capsule TAKE ONE CAPSULE 3 TIMES A DAY (Patient taking differently: Take 300 mg by mouth daily. TAKE ONE CAPSULE 3 TIMES A DAY) 270 capsule 1  . SUMAtriptan (IMITREX) 50 MG tablet Take 1 tab at onset of migraine. May repeat in 2 hours if  needed. Do not exceed 2 tabs in 1 day or 5 tablets in 1 week. 10 tablet 0  . topiramate (TOPAMAX) 100 MG tablet TAKE 1 TABLET EVERY DAY AT BEDTIME 90 tablet 3   No facility-administered medications prior to visit.    PAST MEDICAL HISTORY: Past Medical History  Diagnosis Date  . Seizures 1998    last seizure, one of only two in 1998  . Diabetes mellitus     GDM  diet control    PAST SURGICAL HISTORY: Past Surgical History  Procedure Laterality Date  . Cholecystectomy    . 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 No pertinent family history.  SOCIAL HISTORY: History   Social History  . Marital Status: Married    Spouse Name: N/A  . Number of Children: N/A  . Years of Education: N/A   Occupational History  . Not on file.   Social History Main Topics  . Smoking status: Never Smoker   . Smokeless tobacco: Never Used  . Alcohol Use: No  . Drug Use: No  . Sexual Activity: Not on file   Other Topics Concern  . Not on file   Social History Narrative     PHYSICAL EXAM  Filed Vitals:   07/01/14 0909  BP: 116/78  Pulse: 96  Height: 5\' 4"  (1.626 m)  Weight: 176 lb 3.2 oz (79.924 kg)   Body mass index is 30.23 kg/(m^2).  Generalized: Well developed, obese female in no acute distress  Head: normocephalic and atraumatic,. Oropharynx benign  Neck: Supple, no carotid  bruits  Cardiac: Regular rate rhythm, no murmur  Musculoskeletal: No deformity   Neurological examination   Mentation: Alert oriented to time, place, history taking. Attention span and concentration appropriate. Recent and remote memory intact.  Follows all commands speech and language fluent.   Cranial nerve II-XII: Pupils were equal round reactive to light extraocular movements were full, visual field were full on confrontational test. Facial sensation and strength were normal. hearing was intact to finger rubbing bilaterally. Uvula tongue  midline. head turning and shoulder shrug were normal and symmetric.Tongue protrusion into cheek strength was normal. Motor: normal bulk and tone, full strength in the BUE, BLE, fine finger movements normal, no pronator drift. No focal weakness Sensory: normal and symmetric to light touch, pinprick, and  Vibration, proprioception  Coordination: finger-nose-finger, heel-to-shin bilaterally, no dysmetria Reflexes: Brachioradialis 2/2, biceps 2/2, triceps 2/2, patellar 2/2, Achilles 2/2, plantar responses were flexor bilaterally. Gait and Station: Rising up from seated position without assistance, normal stance,  moderate stride, good arm swing, smooth turning, able to perform tiptoe, and heel walking without difficulty. Tandem gait is steady  DIAGNOSTIC DATA (LABS, IMAGING, TESTING)  ASSESSMENT AND PLAN  39 y.o. year old female  has a past medical history of Seizures (1998) and migraine headaches here to follow-up. Last seizure was 1998. Migraines are in good control.  Continue Dilantin at current dose will refill after labs are back Will obtain CBC, CMP, and Dilantin level Call for any seizure activity Continue Topamax at current dose for migraine, uses Imitrex acutely Call for increase in  migraine headaches Follow-up yearly and when necessary Nilda Riggs, [facility], [facility], APRN  [facility] [address], [address] [place], [place] [address] [phone]

## 2014-07-02 LAB — CBC WITH DIFFERENTIAL/PLATELET
BASOS: 0 %
Basophils Absolute: 0 10*3/uL (ref 0.0–0.2)
EOS: 2 %
Eosinophils Absolute: 0.2 10*3/uL (ref 0.0–0.4)
HCT: 44.5 % (ref 34.0–46.6)
HEMOGLOBIN: 14.9 g/dL (ref 11.1–15.9)
IMMATURE GRANS (ABS): 0 10*3/uL (ref 0.0–0.1)
Immature Granulocytes: 0 %
Lymphocytes Absolute: 2.4 10*3/uL (ref 0.7–3.1)
Lymphs: 24 %
MCH: 29.7 pg (ref 26.6–33.0)
MCHC: 33.5 g/dL (ref 31.5–35.7)
MCV: 89 fL (ref 79–97)
MONOCYTES: 8 %
Monocytes Absolute: 0.7 10*3/uL (ref 0.1–0.9)
NEUTROS PCT: 66 %
Neutrophils Absolute: 6.5 10*3/uL (ref 1.4–7.0)
Platelets: 244 10*3/uL (ref 150–379)
RBC: 5.01 x10E6/uL (ref 3.77–5.28)
RDW: 13.6 % (ref 12.3–15.4)
WBC: 9.9 10*3/uL (ref 3.4–10.8)

## 2014-07-02 LAB — COMPREHENSIVE METABOLIC PANEL
ALBUMIN: 4.6 g/dL (ref 3.5–5.5)
ALT: 16 IU/L (ref 0–32)
AST: 15 IU/L (ref 0–40)
Albumin/Globulin Ratio: 2 (ref 1.1–2.5)
Alkaline Phosphatase: 51 IU/L (ref 39–117)
BILIRUBIN TOTAL: 0.3 mg/dL (ref 0.0–1.2)
BUN/Creatinine Ratio: 18 (ref 8–20)
BUN: 15 mg/dL (ref 6–20)
CHLORIDE: 101 mmol/L (ref 97–108)
CO2: 25 mmol/L (ref 18–29)
CREATININE: 0.85 mg/dL (ref 0.57–1.00)
Calcium: 9.4 mg/dL (ref 8.7–10.2)
GFR calc Af Amer: 100 mL/min/{1.73_m2} (ref >59)
GFR, EST NON AFRICAN AMERICAN: 87 mL/min/{1.73_m2} (ref >59)
GLOBULIN, TOTAL: 2.3 g/dL (ref 1.5–4.5)
Glucose: 87 mg/dL (ref 65–99)
Potassium: 5.3 mmol/L — ABNORMAL HIGH (ref 3.5–5.2)
SODIUM: 139 mmol/L (ref 134–144)
Total Protein: 6.9 g/dL (ref 6.0–8.5)

## 2014-07-02 LAB — PHENYTOIN LEVEL, TOTAL: PHENYTOIN LVL: 6.4 ug/mL — AB (ref 10.0–20.0)

## 2014-07-03 ENCOUNTER — Other Ambulatory Visit: Payer: Self-pay | Admitting: Nurse Practitioner

## 2014-07-03 NOTE — Telephone Encounter (Signed)
Labs pending.  

## 2014-07-07 ENCOUNTER — Telehealth: Payer: Self-pay | Admitting: Nurse Practitioner

## 2014-07-07 DIAGNOSIS — Z5181 Encounter for therapeutic drug level monitoring: Secondary | ICD-10-CM

## 2014-07-07 MED ORDER — PHENYTOIN SODIUM EXTENDED 100 MG PO CAPS: 200.0000 mg | ORAL_CAPSULE | Freq: Two times a day (BID) | ORAL | Status: DC

## 2014-07-07 NOTE — Telephone Encounter (Signed)
TC to patient. Left message that Dilantin level is low. Increase to 2 caps am and 2 caps pm. Recheck in10 days before morning dose. Call for questions.

## 2014-07-28 ENCOUNTER — Other Ambulatory Visit (INDEPENDENT_AMBULATORY_CARE_PROVIDER_SITE_OTHER): Payer: Self-pay

## 2014-07-28 DIAGNOSIS — Z0289 Encounter for other administrative examinations: Secondary | ICD-10-CM

## 2014-07-28 DIAGNOSIS — Z5181 Encounter for therapeutic drug level monitoring: Secondary | ICD-10-CM

## 2014-07-29 ENCOUNTER — Other Ambulatory Visit: Payer: Self-pay | Admitting: *Deleted

## 2014-07-29 ENCOUNTER — Telehealth: Payer: Self-pay

## 2014-07-29 LAB — PHENYTOIN LEVEL, TOTAL: Phenytoin Lvl: 14.8 ug/mL (ref 10.0–20.0)

## 2014-07-29 MED ORDER — PHENYTOIN SODIUM EXTENDED 100 MG PO CAPS: 200.0000 mg | ORAL_CAPSULE | Freq: Two times a day (BID) | ORAL | Status: DC

## 2014-07-29 NOTE — Telephone Encounter (Signed)
Patient returned call and she is aware of lab results. Patient is wanting a 3 month supply of Dilantin called into her pharmacy. Please advise.

## 2014-07-29 NOTE — Telephone Encounter (Signed)
Patient is aware of lab results.

## 2014-07-29 NOTE — Telephone Encounter (Signed)
-----   Message from Lynder ParentsNancy C Martin, NP sent at 07/29/2014  8:34 AM EST ----- Good level of Dilantin. Please call the patient

## 2014-07-29 NOTE — Telephone Encounter (Signed)
Called patient to give lab results. Patient was on another line.  Left message to rtn call with receptionist.

## 2014-10-24 ENCOUNTER — Other Ambulatory Visit: Payer: Self-pay | Admitting: Neurology

## 2014-10-25 NOTE — Telephone Encounter (Signed)
Rx has been signed and faxed  

## 2015-03-17 NOTE — Telephone Encounter (Signed)
Error

## 2015-04-11 ENCOUNTER — Other Ambulatory Visit: Payer: Self-pay | Admitting: Nurse Practitioner

## 2015-04-22 ENCOUNTER — Telehealth: Payer: Self-pay | Admitting: Nurse Practitioner

## 2015-04-22 DIAGNOSIS — G43009 Migraine without aura, not intractable, without status migrainosus: Secondary | ICD-10-CM

## 2015-04-22 MED ORDER — PROMETHAZINE HCL 25 MG PO TABS: 25.0000 mg | ORAL_TABLET | Freq: Three times a day (TID) | ORAL | Status: DC | PRN

## 2015-04-22 NOTE — Telephone Encounter (Signed)
Discussed with patient over the phone. She stated that she had her migraine headache and nauseated since Tuesday, on and off, did not resolve. She has tried ibuprofen, Excedrin Migraine first, and did not work. And then she tried Imitrex which also did not work. I told her that next time if she has migraine headache, take Imitrex as early as possible to be effective. She used Phenergan in the past seems effective, and also treat her nausea in the past. Will prescribe for Phenergan for migraine treatment.  Marvel PlanJindong Tayvia Faughnan, MD PhD Stroke Neurology 04/22/2015 1:35 PM  Meds ordered this encounter  Medications  . promethazine (PHENERGAN) 25 MG tablet    Sig: Take 1 tablet (25 mg total) by mouth every 8 (eight) hours as needed for nausea or vomiting.    Dispense:  30 tablet    Refill:  0

## 2015-04-22 NOTE — Telephone Encounter (Signed)
Rn call patient back about her having headaches needing something for migraines and needs something nausea. Pt stated she was at work and dont get days off for migraines. Pt was last seen 06/2014 by Carolyn(NP) for office visit. Rn ask patient if she has call her PCP about his issue. Pt stated she does not have a PCP. Rn suggest patient to look for a PCP. Rn stated the working MD will assess her chart and look at her meds.Rn stated her provider is not here today. Pt states she has a follow up with Carolyn(NP) in February 2017.Call her at work [phone]

## 2015-04-22 NOTE — Telephone Encounter (Signed)
Patient called to request something for migraine, has taken imitrex, ibuprofen, excedrin migraine, fioricet and hasn't work, it's literally making her sick, requests phenergan Rx.

## 2015-06-14 ENCOUNTER — Telehealth: Payer: Self-pay | Admitting: Nurse Practitioner

## 2015-06-14 MED ORDER — SUMATRIPTAN SUCCINATE 50 MG PO TABS: ORAL_TABLET | ORAL | Status: DC

## 2015-06-14 NOTE — Telephone Encounter (Signed)
Pt needs refill on SUMAtriptan (IMITREX) 50 MG tablet. Thank you

## 2015-09-12 ENCOUNTER — Ambulatory Visit (INDEPENDENT_AMBULATORY_CARE_PROVIDER_SITE_OTHER): Payer: BLUE CROSS/BLUE SHIELD | Admitting: Nurse Practitioner

## 2015-09-12 ENCOUNTER — Encounter: Payer: Self-pay | Admitting: Nurse Practitioner

## 2015-09-12 VITALS — BP 128/86 | HR 88 | Ht 64.0 in | Wt 184.2 lb

## 2015-09-12 DIAGNOSIS — G43009 Migraine without aura, not intractable, without status migrainosus: Secondary | ICD-10-CM

## 2015-09-12 DIAGNOSIS — G40309 Generalized idiopathic epilepsy and epileptic syndromes, not intractable, without status epilepticus: Secondary | ICD-10-CM

## 2015-09-12 DIAGNOSIS — Z5181 Encounter for therapeutic drug level monitoring: Secondary | ICD-10-CM

## 2015-09-12 MED ORDER — SUMATRIPTAN SUCCINATE 50 MG PO TABS: ORAL_TABLET | ORAL | Status: DC

## 2015-09-12 MED ORDER — TOPIRAMATE 100 MG PO TABS: ORAL_TABLET | ORAL | Status: DC

## 2015-09-12 NOTE — Progress Notes (Signed)
[facility]  PATIENT: Isabella Mcintyre DOB: [birth year]   REASON FOR VISIT: Follow-up for migraine, history of epilepsy HISTORY FROM: Patient    HISTORY OF PRESENT ILLNESS:Isabella Mcintyre is a 40 year old right-handed white female with a history of migraine headaches and seizures. She was last seen in this office 07/01/14. She remains on Topamax for her migraines and has not had any severe headaches recently. The patient is doing well with the seizures, currently taking 400 mg  Dilantin daily Her last seizure was 1998. She was on Amitriptyline at one time as a preventive but gained weight. She takes Topamax as migraine preventative. Fiorcet acutely for headaches. She has Imitrex to take acutely. Migraines are worse during her cycle. She returns for reevaluation and labs to monitor side effects of Dilantin   REVIEW OF SYSTEMS: Full 14 system review of systems performed and notable only for those listed, all others are neg:  Constitutional: neg  Cardiovascular: neg Ear/Nose/Throat: neg  Skin: neg Eyes: neg Respiratory: neg Gastroitestinal: neg  Hematology/Lymphatic: neg  Endocrine: neg Musculoskeletal:neg Allergy/Immunology: neg Neurological: History of headaches and seizure disorder Psychiatric: neg Sleep : neg   ALLERGIES: No Known Allergies  HOME MEDICATIONS: Outpatient Prescriptions Prior to Visit  Medication Sig Dispense Refill  . Ascorbic Acid (VITAMIN C) 1000 MG tablet Take 1,000 mg by mouth daily.    . butalbital-acetaminophen-caffeine (FIORICET, ESGIC) 50-325-40 MG per tablet TAKE 1 TABLET BY MOUTH EVERY 6 HOURS AS NEEDED 30 tablet 1  . cholecalciferol (VITAMIN D) 1000 UNITS tablet Take 1,000 Units by mouth daily.    . phenytoin (DILANTIN) 100 MG ER capsule TAKE 2 CAPSULES TWICE A DAY 360 capsule 0  . promethazine (PHENERGAN) 25 MG tablet Take 1 tablet (25 mg total) by mouth every 8 (eight) hours as needed for nausea or vomiting. 30 tablet 0  .  SUMAtriptan (IMITREX) 50 MG tablet Take 1 tab at onset of migraine. May repeat in 2 hours if needed. Do not exceed 2 tabs in 1 day or 5 tablets in 1 week. 9 tablet 0  . topiramate (TOPAMAX) 100 MG tablet TAKE 1 TABLET EVERY DAY AT BEDTIME 90 tablet 3   No facility-administered medications prior to visit.    PAST MEDICAL HISTORY: Past Medical History  Diagnosis Date  . Seizures 1998    last seizure, one of only two in 1998  . Diabetes mellitus     GDM  diet control    PAST SURGICAL HISTORY: Past Surgical History  Procedure Laterality Date  . Cholecystectomy    . 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  SOCIAL HISTORY: Social History   Social History  . Marital Status: Married    Spouse Name: N/A  . Number of Children: N/A  . Years of Education: N/A   Occupational History  . Not on file.   Social History Main Topics  . Smoking status: Never Smoker   . Smokeless tobacco: Never Used  . Alcohol Use: No  . Drug Use: No  . Sexual Activity: Not on file   Other Topics Concern  . Not on file   Social History Narrative     PHYSICAL EXAM  Filed Vitals:   09/12/15 1140  BP: 128/86  Pulse: 88  Height:  (1.626 m)  Weight: 184 lb 3.2 oz (83.553 kg)   Body mass index is 31.6 kg/(m^2). Generalized: Well developed, obese female  in no acute distress  Head: normocephalic and atraumatic,. Oropharynx benign  Neck: Supple, no carotid bruits  Cardiac: Regular rate rhythm, no murmur  Musculoskeletal: No deformity   Neurological examination   Mentation: Alert oriented to time, place, history taking. Attention span and concentration appropriate. Recent and remote memory intact. Follows all commands speech and language fluent.   Cranial nerve II-XII: Pupils were equal round reactive to light extraocular movements were full, visual field were full on confrontational test. Facial sensation and strength  were normal. hearing was intact to finger rubbing bilaterally. Uvula tongue midline. head turning and shoulder shrug were normal and symmetric.Tongue protrusion into cheek strength was normal. Motor: normal bulk and tone, full strength in the BUE, BLE, fine finger movements normal, no pronator drift. No focal weakness Sensory: normal and symmetric to light touch, pinprick, and Vibration,   Coordination: finger-nose-finger, heel-to-shin bilaterally, no dysmetria Reflexes: Brachioradialis 2/2, biceps 2/2, triceps 2/2, patellar 2/2, Achilles 2/2, plantar responses were flexor bilaterally. Gait and Station: Rising up from seated position without assistance, normal stance, moderate stride, good arm swing, smooth turning, able to perform tiptoe, and heel walking without difficulty. Tandem gait is steady  DIAGNOSTIC DATA (LABS, IMAGING, TESTING)   ASSESSMENT AND PLAN 40 y.o. year old female has a past medical history of Seizures (1998) and migraine headaches here to follow-up. Last seizure was 1998. Migraines are in good control.  Continue Dilantin at current dose will refill after labs are back Will obtain CBC, CMP, and Dilantin level Call for any seizure activity Continue Topamax at current dose for migraine, uses Imitrex acutely Call for increase in migraine headaches Follow-up yearly and when necessary Nilda RiggsNancy Carolyn Martin, [facility]NP, [facility], APRN  [facility] [address], [address] [place], [place] [id number] [phone]

## 2015-09-12 NOTE — Patient Instructions (Signed)
Continue Dilantin at current dose will refill after labs are back Will obtain CBC, CMP, and Dilantin level Call for any seizure activity Continue Topamax at current dose for migraine, uses Imitrex acutely Call for increase in migraine headaches Follow-up yearly and when necessary

## 2015-09-13 LAB — CBC WITH DIFFERENTIAL/PLATELET
BASOS ABS: 0 10*3/uL (ref 0.0–0.2)
Basos: 0 %
EOS (ABSOLUTE): 0.2 10*3/uL (ref 0.0–0.4)
Eos: 2 %
HEMOGLOBIN: 15 g/dL (ref 11.1–15.9)
Hematocrit: 44.9 % (ref 34.0–46.6)
Immature Grans (Abs): 0.1 10*3/uL (ref 0.0–0.1)
Immature Granulocytes: 1 %
LYMPHS ABS: 2.5 10*3/uL (ref 0.7–3.1)
LYMPHS: 26 %
MCH: 29.1 pg (ref 26.6–33.0)
MCHC: 33.4 g/dL (ref 31.5–35.7)
MCV: 87 fL (ref 79–97)
MONOCYTES: 8 %
Monocytes Absolute: 0.8 10*3/uL (ref 0.1–0.9)
Neutrophils Absolute: 6.1 10*3/uL (ref 1.4–7.0)
Neutrophils: 63 %
Platelets: 245 10*3/uL (ref 150–379)
RBC: 5.15 x10E6/uL (ref 3.77–5.28)
RDW: 13.6 % (ref 12.3–15.4)
WBC: 9.7 10*3/uL (ref 3.4–10.8)

## 2015-09-13 LAB — COMPREHENSIVE METABOLIC PANEL
A/G RATIO: 2 (ref 1.2–2.2)
ALBUMIN: 4.7 g/dL (ref 3.5–5.5)
ALK PHOS: 55 IU/L (ref 39–117)
ALT: 16 IU/L (ref 0–32)
AST: 14 IU/L (ref 0–40)
BUN/Creatinine Ratio: 17 (ref 9–23)
BUN: 14 mg/dL (ref 6–24)
Bilirubin Total: 0.2 mg/dL (ref 0.0–1.2)
CHLORIDE: 100 mmol/L (ref 96–106)
CO2: 24 mmol/L (ref 18–29)
Calcium: 9.1 mg/dL (ref 8.7–10.2)
Creatinine, Ser: 0.81 mg/dL (ref 0.57–1.00)
GFR calc Af Amer: 105 mL/min/{1.73_m2} (ref >59)
GFR calc non Af Amer: 91 mL/min/{1.73_m2} (ref >59)
GLOBULIN, TOTAL: 2.4 g/dL (ref 1.5–4.5)
Glucose: 84 mg/dL (ref 65–99)
Potassium: 5.5 mmol/L — ABNORMAL HIGH (ref 3.5–5.2)
Sodium: 141 mmol/L (ref 134–144)
Total Protein: 7.1 g/dL (ref 6.0–8.5)

## 2015-09-13 LAB — PHENYTOIN LEVEL, TOTAL: PHENYTOIN (DILANTIN), SERUM: 10.8 ug/mL (ref 10.0–20.0)

## 2015-09-14 ENCOUNTER — Telehealth: Payer: Self-pay | Admitting: *Deleted

## 2015-09-14 NOTE — Telephone Encounter (Signed)
-----   Message from Nilda RiggsNancy Carolyn Martin, NP sent at 09/13/2015  2:48 PM EDT ----- Labs ok except K+ a little high. Recheck in 1 month

## 2015-09-14 NOTE — Telephone Encounter (Signed)
LM[facility] mobile, to call back for lab results.

## 2015-09-15 NOTE — Telephone Encounter (Signed)
Spoke to pt and relayed that her labs ok, potassium slightly elevated.  Will recheck in one month.  Pt verbalized understanding.

## 2015-09-15 NOTE — Telephone Encounter (Signed)
Patient returned Sandy's call. Call back before 9am [phone] can leave message or after 9am work# [phone].

## 2015-09-15 NOTE — Telephone Encounter (Signed)
I have it on my reminder list to order closer to the date.

## 2015-10-18 ENCOUNTER — Telehealth: Payer: Self-pay | Admitting: Nurse Practitioner

## 2015-10-18 DIAGNOSIS — Z5181 Encounter for therapeutic drug level monitoring: Secondary | ICD-10-CM

## 2015-10-18 NOTE — Telephone Encounter (Signed)
Called and LMVM for pt that requesting repeat lab.  (elevated K+ level one month ago).

## 2015-10-18 NOTE — Telephone Encounter (Signed)
Please call patient to remind her to repeat BMP. Orders in the system

## 2015-10-19 NOTE — Telephone Encounter (Signed)
LMVM mobile for pt relating to repeat lab (BMP) gave hours for [facility].  She is to cal back if questions.

## 2015-10-19 NOTE — Telephone Encounter (Addendum)
Pt returned Sandy's call. Pt sts she has appt with OBGYN on Friday and will have labs done then, she will let that provider know what is "going on" and she is sure K level will be drawn. She sts "if for some reason labs aren't drawn she will come into clinic sometime next week".  FYI

## 2015-10-24 ENCOUNTER — Other Ambulatory Visit (INDEPENDENT_AMBULATORY_CARE_PROVIDER_SITE_OTHER): Payer: Self-pay

## 2015-10-24 ENCOUNTER — Other Ambulatory Visit: Payer: Self-pay | Admitting: Nurse Practitioner

## 2015-10-24 DIAGNOSIS — Z0289 Encounter for other administrative examinations: Secondary | ICD-10-CM

## 2015-10-25 LAB — BASIC METABOLIC PANEL
BUN/Creatinine Ratio: 16 (ref 9–23)
BUN: 10 mg/dL (ref 6–24)
CALCIUM: 8.9 mg/dL (ref 8.7–10.2)
CO2: 22 mmol/L (ref 18–29)
CREATININE: 0.64 mg/dL (ref 0.57–1.00)
Chloride: 100 mmol/L (ref 96–106)
GFR calc Af Amer: 129 mL/min/{1.73_m2} (ref >59)
GFR, EST NON AFRICAN AMERICAN: 112 mL/min/{1.73_m2} (ref >59)
Glucose: 84 mg/dL (ref 65–99)
POTASSIUM: 3.8 mmol/L (ref 3.5–5.2)
SODIUM: 140 mmol/L (ref 134–144)

## 2015-10-31 ENCOUNTER — Telehealth: Payer: Self-pay | Admitting: Nurse Practitioner

## 2015-10-31 NOTE — Telephone Encounter (Signed)
Called and left patient a message relaying labs were ok. Relayed on Message if andy questions please give the office a call back.

## 2015-10-31 NOTE — Progress Notes (Signed)
Quick Note:  Called and left patient a message relaying labs were ok. Any question's or concerns please call the office back. ______

## 2015-10-31 NOTE — Telephone Encounter (Signed)
Opened in errow

## 2015-10-31 NOTE — Telephone Encounter (Signed)
-----   Message from Nilda RiggsNancy Carolyn Martin, NP sent at 10/25/2015 12:40 PM EDT ----- Labs ok please call

## 2015-11-13 ENCOUNTER — Other Ambulatory Visit: Payer: Self-pay | Admitting: Nurse Practitioner

## 2015-12-14 ENCOUNTER — Other Ambulatory Visit: Payer: Self-pay | Admitting: Neurology

## 2016-09-17 ENCOUNTER — Ambulatory Visit (INDEPENDENT_AMBULATORY_CARE_PROVIDER_SITE_OTHER): Payer: 59 | Admitting: Nurse Practitioner

## 2016-09-17 ENCOUNTER — Encounter: Payer: Self-pay | Admitting: Nurse Practitioner

## 2016-09-17 VITALS — BP 119/83 | HR 77 | Ht 64.0 in | Wt 175.4 lb

## 2016-09-17 DIAGNOSIS — G40309 Generalized idiopathic epilepsy and epileptic syndromes, not intractable, without status epilepticus: Secondary | ICD-10-CM | POA: Diagnosis not present

## 2016-09-17 DIAGNOSIS — G43009 Migraine without aura, not intractable, without status migrainosus: Secondary | ICD-10-CM

## 2016-09-17 MED ORDER — TOPIRAMATE 100 MG PO TABS
ORAL_TABLET | ORAL | 1 refills | Status: DC
Start: 2016-09-17 — End: 2017-12-31

## 2016-09-17 MED ORDER — SUMATRIPTAN SUCCINATE 50 MG PO TABS: ORAL_TABLET | ORAL | 11 refills | Status: DC

## 2016-09-17 NOTE — Progress Notes (Signed)
[facility]  PATIENT: Isabella Mcintyre DOB: 1975/09/17   REASON FOR VISIT: Follow-up for migraine, history of epilepsy HISTORY FROM: Patient    HISTORY OF PRESENT ILLNESS:Isabella Mcintyre is a 41 year old right-handed white female with a history of migraine headaches and seizures. She was last seen in this office 09/12/15. She remains on Topamax for her migraines and her headaches are in fair control. She has not kept a record of her headaches . The patient is doing well with the seizures, currently taking 400 mg  Dilantin daily Her last seizure was 1998. She was on Amitriptyline at one time as a preventive but gained weight. She takes Topamax as migraine preventative. Fiorcet acutely for headaches. She has Imitrex to take acutely. Migraines are worse during her cycle. She returns for reevaluation and labs to monitor side effects of Dilantin   REVIEW OF SYSTEMS: Full 14 system review of systems performed and notable only for those listed, all others are neg:  Constitutional: neg  Cardiovascular: neg Ear/Nose/Throat: neg  Skin: neg Eyes: neg Respiratory: neg Gastroitestinal: neg  Hematology/Lymphatic: neg  Endocrine: neg Musculoskeletal:neg Allergy/Immunology: neg Neurological: History of headaches and seizure disorder Psychiatric: neg Sleep : neg   ALLERGIES: No Known Allergies  HOME MEDICATIONS: Outpatient Medications Prior to Visit  Medication Sig Dispense Refill  . Ascorbic Acid (VITAMIN C) 1000 MG tablet Take 1,000 mg by mouth daily.    . butalbital-acetaminophen-caffeine (FIORICET, ESGIC) 50-325-40 MG tablet TAKE 1 TABLET BY MOUTH EVERY 6 HOURS AS NEEDED 30 tablet 1  . cholecalciferol (VITAMIN D) 1000 UNITS tablet Take 1,000 Units by mouth daily.    . phenytoin (DILANTIN) 100 MG ER capsule TAKE 2 CAPSULES TWICE A DAY 360 capsule 3  . promethazine (PHENERGAN) 25 MG tablet Take 1 tablet (25 mg total) by mouth every 8 (eight) hours as needed for nausea  or vomiting. 30 tablet 0  . SUMAtriptan (IMITREX) 50 MG tablet Take 1 tab at onset of migraine. May repeat in 2 hours if needed. Do not exceed 2 tabs in 1 day or 5 tablets in 1 week. 9 tablet 11  . topiramate (TOPAMAX) 100 MG tablet TAKE 1 TABLET EVERY DAY AT BEDTIME 90 tablet 3   No facility-administered medications prior to visit.     PAST MEDICAL HISTORY: Past Medical History:  Diagnosis Date  . Diabetes mellitus    GDM  diet control  . Seizures (HCC) 1998   last seizure, one of only two in 1998    PAST SURGICAL HISTORY: Past Surgical History:  Procedure Laterality Date  . CHOLECYSTECTOMY    . 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  SOCIAL HISTORY: Social History   Social History  . Marital status: Married    Spouse name: N/A  . Number of children: N/A  . Years of education: N/A   Occupational History  . Not on file.   Social History Main Topics  . Smoking status: Never Smoker  . Smokeless tobacco: Never Used  . Alcohol use No  . Drug use: No  . Sexual activity: Not on file   Other Topics Concern  . Not on file   Social History Narrative  . No narrative on file     PHYSICAL EXAM  Vitals:   09/17/16 1248  BP: 119/83  Pulse: 77  Weight: 175 lb 6.4 oz (79.6 kg)  Height:  (1.626 m)  Body mass index is 30.11 kg/m. Generalized: Well developed, obese female in no acute distress  Head: normocephalic and atraumatic,. Oropharynx benign  Neck: Supple, no carotid bruits  Musculoskeletal: No deformity   Neurological examination   Mentation: Alert oriented to time, place, history taking. Attention span and concentration appropriate. Recent and remote memory intact. Follows all commands speech and language fluent.   Cranial nerve II-XII: Pupils were equal round reactive to light extraocular movements were full, visual field were full on confrontational test. Facial sensation and  strength were normal. hearing was intact to finger rubbing bilaterally. Uvula tongue midline. head turning and shoulder shrug were normal and symmetric.Tongue protrusion into cheek strength was normal. Motor: normal bulk and tone, full strength in the BUE, BLE, fine finger movements normal, no pronator drift. No focal weakness Sensory: normal and symmetric to light touch, pinprick, and Vibration in the upper and lower extremities,   Coordination: finger-nose-finger, heel-to-shin bilaterally, no dysmetria Reflexes: Brachioradialis 2/2, biceps 2/2, triceps 2/2, patellar 2/2, Achilles 2/2, plantar responses were flexor bilaterally. Gait and Station: Rising up from seated position without assistance, normal stance, moderate stride, good arm swing, smooth turning, able to perform tiptoe, and heel walking without difficulty. Tandem gait is steady  DIAGNOSTIC DATA (LABS, IMAGING, TESTING)   ASSESSMENT AND PLAN 41 y.o. year old female has a past medical history of Seizures (1998) and migraine headaches here to follow-up. Last seizure was 1998. Migraines are in fair  control.  Continue Dilantin at current dose will refill after labs are back Will obtain CBC, CMP to monitor for adverse effects of Dilantin,  Dilantin level to monitor for toxicity, therapeutic level Call for any seizure activity Increase Topamax to 50 mg am  at hs as prepped migraine preventive Continue  Imitrex acutely Migraine trackerAPP  to record headaches. Follow-up 6 months I spent 25 minutes in total face to face time with the patient more than 50% of which was spent counseling and coordination of care, reviewing test results reviewing medications and discussing and reviewing the diagnosis of migraine headache and further treatment options along with seizure disorder. Questions answered. Botox discussed. , Cline Crock, [facility], APRN  [facility] [address], [address] [place], [place]  [address] [phone]

## 2016-09-17 NOTE — Progress Notes (Signed)
I have read the note, and I agree with the clinical assessment and plan.  Isabella Mcintyre,Isabella Mcintyre   

## 2016-09-17 NOTE — Patient Instructions (Addendum)
Continue Dilantin at current dose will refill after labs are back Will obtain CBC, CMP, and Dilantin level Call for any seizure activity Increase Topamax to 50 mg am  at hs Continue  Imitrex acutely Migraine tracker to record headaches. Follow-up 6 months  Call for worsening of headaches

## 2016-09-18 ENCOUNTER — Other Ambulatory Visit: Payer: Self-pay | Admitting: Nurse Practitioner

## 2016-09-18 ENCOUNTER — Telehealth: Payer: Self-pay | Admitting: *Deleted

## 2016-09-18 LAB — COMPREHENSIVE METABOLIC PANEL
A/G RATIO: 2 (ref 1.2–2.2)
ALBUMIN: 4.3 g/dL (ref 3.5–5.5)
ALT: 16 IU/L (ref 0–32)
AST: 15 IU/L (ref 0–40)
Alkaline Phosphatase: 55 IU/L (ref 39–117)
BUN/Creatinine Ratio: 14 (ref 9–23)
BUN: 12 mg/dL (ref 6–24)
Bilirubin Total: <0.2 mg/dL (ref 0.0–1.2)
CALCIUM: 9.1 mg/dL (ref 8.7–10.2)
CO2: 24 mmol/L (ref 18–29)
CREATININE: 0.86 mg/dL (ref 0.57–1.00)
Chloride: 102 mmol/L (ref 96–106)
GFR, EST AFRICAN AMERICAN: 97 mL/min/{1.73_m2} (ref >59)
GFR, EST NON AFRICAN AMERICAN: 84 mL/min/{1.73_m2} (ref >59)
GLOBULIN, TOTAL: 2.2 g/dL (ref 1.5–4.5)
Glucose: 93 mg/dL (ref 65–99)
POTASSIUM: 5 mmol/L (ref 3.5–5.2)
SODIUM: 139 mmol/L (ref 134–144)
TOTAL PROTEIN: 6.5 g/dL (ref 6.0–8.5)

## 2016-09-18 LAB — CBC WITH DIFFERENTIAL/PLATELET
BASOS: 0 %
Basophils Absolute: 0 10*3/uL (ref 0.0–0.2)
EOS (ABSOLUTE): 0.2 10*3/uL (ref 0.0–0.4)
EOS: 2 %
HEMATOCRIT: 41.2 % (ref 34.0–46.6)
Hemoglobin: 13.7 g/dL (ref 11.1–15.9)
IMMATURE GRANS (ABS): 0 10*3/uL (ref 0.0–0.1)
IMMATURE GRANULOCYTES: 1 %
Lymphocytes Absolute: 2.6 10*3/uL (ref 0.7–3.1)
Lymphs: 30 %
MCH: 29.3 pg (ref 26.6–33.0)
MCHC: 33.3 g/dL (ref 31.5–35.7)
MCV: 88 fL (ref 79–97)
MONOS ABS: 0.7 10*3/uL (ref 0.1–0.9)
Monocytes: 8 %
NEUTROS ABS: 5.1 10*3/uL (ref 1.4–7.0)
NEUTROS PCT: 59 %
PLATELETS: 245 10*3/uL (ref 150–379)
RBC: 4.68 x10E6/uL (ref 3.77–5.28)
RDW: 13.3 % (ref 12.3–15.4)
WBC: 8.6 10*3/uL (ref 3.4–10.8)

## 2016-09-18 LAB — PHENYTOIN LEVEL, TOTAL: Phenytoin (Dilantin), Serum: 9.8 ug/mL — ABNORMAL LOW (ref 10.0–20.0)

## 2016-09-18 MED ORDER — PHENYTOIN SODIUM EXTENDED 100 MG PO CAPS: 200.0000 mg | ORAL_CAPSULE | Freq: Two times a day (BID) | ORAL | 3 refills | Status: DC

## 2016-09-18 NOTE — Telephone Encounter (Signed)
LMVM (home ok per DPR)  for pt that lab results stable.  She is to call back if questions.

## 2016-09-18 NOTE — Telephone Encounter (Signed)
-----   Message from Nilda Riggs, NP sent at 09/18/2016  9:28 AM EDT ----- Labs stable please call the patient

## 2017-01-28 ENCOUNTER — Other Ambulatory Visit: Payer: Self-pay | Admitting: Neurology

## 2017-01-29 ENCOUNTER — Other Ambulatory Visit: Payer: Self-pay | Admitting: *Deleted

## 2017-01-29 NOTE — Telephone Encounter (Signed)
Fioricet refill [facility] [place].

## 2017-10-22 ENCOUNTER — Other Ambulatory Visit: Payer: Self-pay | Admitting: Nurse Practitioner

## 2017-11-14 ENCOUNTER — Other Ambulatory Visit: Payer: Self-pay | Admitting: Otolaryngology

## 2017-11-14 DIAGNOSIS — E041 Nontoxic single thyroid nodule: Secondary | ICD-10-CM

## 2017-11-25 ENCOUNTER — Other Ambulatory Visit: Payer: Self-pay | Admitting: Nurse Practitioner

## 2017-11-27 ENCOUNTER — Ambulatory Visit
Admission: RE | Admit: 2017-11-27 | Discharge: 2017-11-27 | Disposition: A | Discharge status: Home / Self Care | Payer: 59 | Source: Ambulatory Visit | Attending: Otolaryngology | Admitting: Otolaryngology

## 2017-11-27 DIAGNOSIS — E041 Nontoxic single thyroid nodule: Secondary | ICD-10-CM

## 2017-12-08 ENCOUNTER — Other Ambulatory Visit: Payer: Self-pay | Admitting: Nurse Practitioner

## 2017-12-11 ENCOUNTER — Telehealth: Payer: Self-pay | Admitting: Nurse Practitioner

## 2017-12-11 ENCOUNTER — Other Ambulatory Visit: Payer: Self-pay | Admitting: Nurse Practitioner

## 2017-12-11 NOTE — Telephone Encounter (Signed)
Received call back from patient who stated [facility], and prescriptions were not received. This RN stated will call pharmacy. Called [facility] , spoke with Johnnette BarriosLouisa, pharmacist who stated both Rx were never received. This RN gave her verbal orders per NP's previous orders. Louisa repeated both prescriptions correctly, verbalized understanding, appreciation. She stated she would get them ready for patient now.

## 2017-12-11 NOTE — Telephone Encounter (Signed)
Pt has called for refills on butalbital-acetaminophen-caffeine (FIORICET, ESGIC) 50-325-40 MG tablet and   SUMAtriptan (IMITREX) 50 MG tablet     Please call into   [id number] [place], [place] - [address] [phone] (Phone) [phone] (Fax)      Pt 1 yr f/u is showing for 12-31-2017

## 2017-12-11 NOTE — Telephone Encounter (Signed)
LVM advising the patient that Enid Skeens Martin, NP refilled both these medications on 11/26/17. Advised she check back with her pharmacy. Left number for questions.

## 2017-12-19 ENCOUNTER — Other Ambulatory Visit: Payer: Self-pay | Admitting: Otolaryngology

## 2017-12-20 ENCOUNTER — Other Ambulatory Visit: Payer: Self-pay | Admitting: Otolaryngology

## 2017-12-20 DIAGNOSIS — E041 Nontoxic single thyroid nodule: Secondary | ICD-10-CM

## 2017-12-30 NOTE — Progress Notes (Signed)
[facility]  PATIENT: Isabella Mcintyre DOB: [birth year]   REASON FOR VISIT: Follow-up for migraine, history of epilepsy HISTORY FROM: Patient    HISTORY OF PRESENT ILLNESS:Ms Isabella Mcintyre is a 42 year old right-handed white female with a history of migraine headaches and seizures. She was last seen in this office 09/17/16. She remains on Topamax for her migraines and her headaches are in good control.  The patient is doing well with the seizures, currently taking 400 mg  Dilantin daily Her last seizure was 1998. She was on Amitriptyline at one time as a preventive but gained weight. She takes Topamax as migraine preventative. Fiorcet acutely for headaches. She has Imitrex to take acutely. Migraines are worse during her cycle. She returns for reevaluation and labs to monitor side effects of Dilantin.  She is currently receiving a work-up for a goiter.  REVIEW OF SYSTEMS: Full 14 system review of systems performed and notable only for those listed, all others are neg:  Constitutional: neg  Cardiovascular: neg Ear/Nose/Throat: neg  Skin: neg Eyes: neg Respiratory: neg Gastroitestinal: neg  Hematology/Lymphatic: neg  Endocrine: neg Musculoskeletal:neg Allergy/Immunology: neg Neurological: History of headaches and seizure disorder Psychiatric: neg Sleep : neg   ALLERGIES: No Known Allergies  HOME MEDICATIONS: Outpatient Medications Prior to Visit  Medication Sig Dispense Refill  . Ascorbic Acid (VITAMIN C) 1000 MG tablet Take 1,000 mg by mouth daily.    . butalbital-acetaminophen-caffeine (FIORICET, ESGIC) 50-325-40 MG tablet TAKE 1 TABLET BY MOUTH EVERY 6 HOURS AS NEEDED 30 tablet 1  . cholecalciferol (VITAMIN D) 1000 UNITS tablet Take 1,000 Units by mouth daily.    . phenytoin (DILANTIN) 100 MG ER capsule TAKE 2 CAPSULES BY MOUTH 2 TIMES DAILY. 360 capsule 1  . promethazine (PHENERGAN) 25 MG tablet Take 1 tablet (25 mg total) by mouth every 8 (eight) hours as  needed for nausea or vomiting. 30 tablet 0  . SUMAtriptan (IMITREX) 50 MG tablet TAKE 1 TAB AT ONSET OF MIGRAINE. MAY REPEAT IN 2 HOURS IF NEEDED. DO NOT EXCEED 2 TAB IN 1 DAY/ 5WEE 9 tablet 11  . topiramate (TOPAMAX) 100 MG tablet TAKE 1/2  TABLET EVERY morning and 1 tab at hs 135 tablet 1   No facility-administered medications prior to visit.     PAST MEDICAL HISTORY: Past Medical History:  Diagnosis Date  . Diabetes mellitus    GDM  diet control  . Seizures (HCC) 1998   last seizure, one of only two in 1998    PAST SURGICAL HISTORY: Past Surgical History:  Procedure Laterality Date  . CHOLECYSTECTOMY    . 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 No pertinent family history.  SOCIAL HISTORY: Social History   Socioeconomic History  . Marital status: Married    Spouse name: Not on file  . Number of children: Not on file  . Years of education: Not on file  . Highest education level: Not on file  Occupational History  . Not on file  Social Needs  . Financial resource strain: Not on file  . Food insecurity:    Worry: Not on file    Inability: Not on file  . Transportation needs:    Medical: Not on file    Non-medical: Not on file  Tobacco Use  . Smoking status: Never Smoker  . Smokeless tobacco: Never Used  Substance and Sexual Activity  . Alcohol use: No  . Drug  use: No  . Sexual activity: Not on file  Lifestyle  . Physical activity:    Days per week: Not on file    Minutes per session: Not on file  . Stress: Not on file  Relationships  . Social connections:    Talks on phone: Not on file    Gets together: Not on file    Attends religious service: Not on file    Active member of club or organization: Not on file    Attends meetings of clubs or organizations: Not on file    Relationship status: Not on file  . Intimate partner violence:    Fear of current or ex partner: Not on file    Emotionally abused:  Not on file    Physically abused: Not on file    Forced sexual activity: Not on file  Other Topics Concern  . Not on file  Social History Narrative  . Not on file     PHYSICAL EXAM  Vitals:   12/31/17 1300  BP: (!) 137/93  Pulse: 84  Weight: 181 lb 6.4 oz (82.3 kg)  Height: 5\' 4"  (1.626 m)   Body mass index is 31.14 kg/m. Generalized: Well developed, obese female in no acute distress  Head: normocephalic and atraumatic,. Oropharynx benign  Neck: Supple,   Musculoskeletal: No deformity   Neurological examination   Mentation: Alert oriented to time, place, history taking. Attention span and concentration appropriate. Recent and remote memory intact. Follows all commands speech and language fluent.   Cranial nerve II-XII: Pupils were equal round reactive to light extraocular movements were full, visual field were full on confrontational test. Facial sensation and strength were normal. hearing was intact to finger rubbing bilaterally. Uvula tongue midline. head turning and shoulder shrug were normal and symmetric.Tongue protrusion into cheek strength was normal. Motor: normal bulk and tone, full strength in the BUE, BLE, Sensory: normal and symmetric to light touch,  Coordination: finger-nose-finger, heel-to-shin bilaterally, no dysmetria Reflexes: Brachioradialis 2/2, biceps 2/2, triceps 2/2, patellar 2/2, Achilles 2/2, plantar responses were flexor bilaterally. Gait and Station: Rising up from seated position without assistance, normal stance, moderate stride, good arm swing, smooth turning, able to perform tiptoe, and heel walking without difficulty. Tandem gait is steady  DIAGNOSTIC DATA (LABS, IMAGING, TESTING)   ASSESSMENT AND PLAN 42 y.o. year old female has a past medical history of Seizures (1998) and migraine headaches here to follow-up. Last seizure was 1998. Migraines are in good  control.  Continue Dilantin at current dose  Will obtain CBC, CMP to monitor  for adverse effects of Dilantin,  Dilantin level to monitor for toxicity, therapeutic level Call for any seizure activity Continue  Topamax to 50 mg am 100mg  at hs as  migraine preventive Continue  Imitrex acutely Migraine trackerAPP  to record headaches.if they increase Follow-up yearly I spent 20 minutes in total face to face time with the patient more than 50% of which was spent counseling and coordination of care, reviewing test results reviewing medications and discussing and reviewing the diagnosis of migraine headache and further treatment options along with seizure disorder.  Nilda RiggsNancy Carolyn Chalyn Amescua, [facility]NP, [facility], APRN  [facility] [address], [address] [place], [place] [address] [phone]

## 2017-12-31 ENCOUNTER — Ambulatory Visit: Payer: 59 | Admitting: Nurse Practitioner

## 2017-12-31 ENCOUNTER — Encounter: Payer: Self-pay | Admitting: Nurse Practitioner

## 2017-12-31 VITALS — BP 137/93 | HR 84 | Ht 64.0 in | Wt 181.4 lb

## 2017-12-31 DIAGNOSIS — G40309 Generalized idiopathic epilepsy and epileptic syndromes, not intractable, without status epilepticus: Secondary | ICD-10-CM | POA: Diagnosis not present

## 2017-12-31 DIAGNOSIS — Z5181 Encounter for therapeutic drug level monitoring: Secondary | ICD-10-CM | POA: Diagnosis not present

## 2017-12-31 DIAGNOSIS — G43009 Migraine without aura, not intractable, without status migrainosus: Secondary | ICD-10-CM

## 2017-12-31 MED ORDER — TOPIRAMATE 100 MG PO TABS: ORAL_TABLET | ORAL | 3 refills | Status: DC

## 2017-12-31 NOTE — Patient Instructions (Signed)
Continue Dilantin at current dose  Will obtain CBC, CMP to monitor for adverse effects of Dilantin,  Dilantin level to monitor for toxicity, therapeutic level Call for any seizure activity Continue  Topamax to 50 mg am 100mg  at hs as  migraine preventive Continue  Imitrex acutely Migraine trackerAPP  to record headaches.if they increase Follow-up yearly

## 2017-12-31 NOTE — Progress Notes (Signed)
I have read the note, and I agree with the clinical assessment and plan.  Isabella Mcintyre   

## 2018-01-01 ENCOUNTER — Telehealth: Payer: Self-pay | Admitting: *Deleted

## 2018-01-01 LAB — COMPREHENSIVE METABOLIC PANEL
ALT: 17 IU/L (ref 0–32)
AST: 18 IU/L (ref 0–40)
Albumin/Globulin Ratio: 1.7 (ref 1.2–2.2)
Albumin: 4.3 g/dL (ref 3.5–5.5)
Alkaline Phosphatase: 55 IU/L (ref 39–117)
BILIRUBIN TOTAL: 0.2 mg/dL (ref 0.0–1.2)
BUN/Creatinine Ratio: 10 (ref 9–23)
BUN: 9 mg/dL (ref 6–24)
CALCIUM: 8.8 mg/dL (ref 8.7–10.2)
CHLORIDE: 102 mmol/L (ref 96–106)
CO2: 22 mmol/L (ref 20–29)
Creatinine, Ser: 0.86 mg/dL (ref 0.57–1.00)
GFR, EST AFRICAN AMERICAN: 96 mL/min/{1.73_m2} (ref >59)
GFR, EST NON AFRICAN AMERICAN: 84 mL/min/{1.73_m2} (ref >59)
GLUCOSE: 106 mg/dL — AB (ref 65–99)
Globulin, Total: 2.6 g/dL (ref 1.5–4.5)
Potassium: 4 mmol/L (ref 3.5–5.2)
Sodium: 140 mmol/L (ref 134–144)
TOTAL PROTEIN: 6.9 g/dL (ref 6.0–8.5)

## 2018-01-01 LAB — CBC WITH DIFFERENTIAL/PLATELET
BASOS ABS: 0 10*3/uL (ref 0.0–0.2)
Basos: 0 %
EOS (ABSOLUTE): 0.5 10*3/uL — AB (ref 0.0–0.4)
Eos: 5 %
Hematocrit: 43.9 % (ref 34.0–46.6)
Hemoglobin: 14.8 g/dL (ref 11.1–15.9)
IMMATURE GRANS (ABS): 0.1 10*3/uL (ref 0.0–0.1)
IMMATURE GRANULOCYTES: 1 %
LYMPHS: 24 %
Lymphocytes Absolute: 2.6 10*3/uL (ref 0.7–3.1)
MCH: 29.4 pg (ref 26.6–33.0)
MCHC: 33.7 g/dL (ref 31.5–35.7)
MCV: 87 fL (ref 79–97)
Monocytes Absolute: 0.6 10*3/uL (ref 0.1–0.9)
Monocytes: 6 %
NEUTROS ABS: 7 10*3/uL (ref 1.4–7.0)
Neutrophils: 64 %
PLATELETS: 227 10*3/uL (ref 150–450)
RBC: 5.03 x10E6/uL (ref 3.77–5.28)
RDW: 13.6 % (ref 12.3–15.4)
WBC: 10.7 10*3/uL (ref 3.4–10.8)

## 2018-01-01 LAB — PHENYTOIN LEVEL, TOTAL: PHENYTOIN (DILANTIN), SERUM: 19.4 ug/mL (ref 10.0–20.0)

## 2018-01-01 NOTE — Telephone Encounter (Signed)
LMVM for pt that her lab results looked good per CM/NP.

## 2018-01-01 NOTE — Telephone Encounter (Signed)
-----   Message from Nilda RiggsNancy Carolyn Martin, NP sent at 01/01/2018 12:06 PM EDT ----- Labs look good. Please call patient

## 2018-01-14 ENCOUNTER — Ambulatory Visit
Admission: RE | Admit: 2018-01-14 | Discharge: 2018-01-14 | Disposition: A | Discharge status: Home / Self Care | Payer: 59 | Source: Ambulatory Visit | Attending: Otolaryngology | Admitting: Otolaryngology

## 2018-01-14 ENCOUNTER — Other Ambulatory Visit (HOSPITAL_COMMUNITY)
Admission: RE | Admit: 2018-01-14 | Discharge: 2018-01-14 | Disposition: A | Discharge status: Home / Self Care | Payer: 59 | Source: Ambulatory Visit | Attending: Radiology | Admitting: Radiology

## 2018-01-14 DIAGNOSIS — E041 Nontoxic single thyroid nodule: Secondary | ICD-10-CM | POA: Insufficient documentation

## 2018-04-29 ENCOUNTER — Other Ambulatory Visit: Payer: Self-pay | Admitting: Nurse Practitioner

## 2018-07-08 ENCOUNTER — Encounter: Payer: Self-pay | Admitting: Neurology

## 2018-07-08 ENCOUNTER — Telehealth: Payer: Self-pay | Admitting: Nurse Practitioner

## 2018-07-08 NOTE — Telephone Encounter (Signed)
Pt has called to request that her phenytoin (DILANTIN) 100 MG ER capsule now be sent thru [facility] [phone] Pt's new insurance information is [facility] Member ID:[id number] Group#[id number] RX [id number] RX MM#[id number]

## 2018-07-08 NOTE — Telephone Encounter (Signed)
Noted  

## 2018-12-23 ENCOUNTER — Telehealth: Payer: Self-pay | Admitting: *Deleted

## 2018-12-23 MED ORDER — PHENYTOIN SODIUM EXTENDED 100 MG PO CAPS: 200.0000 mg | ORAL_CAPSULE | Freq: Two times a day (BID) | ORAL | 1 refills | Status: DC

## 2018-12-23 NOTE — Telephone Encounter (Signed)
[facility]. Per phone note dated 07/08/18, patient now requests phenytoin refills through [facility] due to insurance change. She has FU scheduled. E scribed refill to [facility].

## 2019-01-06 ENCOUNTER — Ambulatory Visit: Payer: 59 | Admitting: Neurology

## 2019-01-21 ENCOUNTER — Ambulatory Visit: Payer: 59 | Admitting: Neurology

## 2019-01-21 ENCOUNTER — Encounter: Payer: Self-pay | Admitting: Neurology

## 2019-01-21 ENCOUNTER — Other Ambulatory Visit: Payer: Self-pay

## 2019-01-21 VITALS — BP 128/82 | HR 97 | Temp 97.1°F | Ht 63.75 in | Wt 180.3 lb

## 2019-01-21 DIAGNOSIS — G40309 Generalized idiopathic epilepsy and epileptic syndromes, not intractable, without status epilepticus: Secondary | ICD-10-CM | POA: Diagnosis not present

## 2019-01-21 DIAGNOSIS — G43009 Migraine without aura, not intractable, without status migrainosus: Secondary | ICD-10-CM

## 2019-01-21 DIAGNOSIS — Z5181 Encounter for therapeutic drug level monitoring: Secondary | ICD-10-CM | POA: Diagnosis not present

## 2019-01-21 MED ORDER — NARATRIPTAN HCL 2.5 MG PO TABS: 2.5000 mg | ORAL_TABLET | ORAL | 3 refills | Status: DC | PRN

## 2019-01-21 MED ORDER — PROMETHAZINE HCL 25 MG PO TABS: 25.0000 mg | ORAL_TABLET | Freq: Three times a day (TID) | ORAL | 3 refills | Status: DC | PRN

## 2019-01-21 MED ORDER — PROPRANOLOL HCL 20 MG PO TABS: ORAL_TABLET | ORAL | 3 refills | Status: DC

## 2019-01-21 NOTE — Patient Instructions (Signed)
We will use propranolol to help prevent the headache. Call for any dose adjustments.  Inderal (propranolol) is a blood pressure medication that is commonly used for migraine headaches. This is a type of beta blocker. The most common side effects include low heart rate, dizziness, fatigue, and increased depression. This medication may worsen asthma. If you believe that you are having side effects on this medication, please contact our office.   Stop the Imitrex and we will use Amerge if needed for the headache.

## 2019-01-21 NOTE — Progress Notes (Signed)
Reason for visit: Headaches, seizures  Isabella Mcintyre is an 43 y.o. female  History of present illness:  Isabella Mcintyre is a 43 year old right-handed white female with a history of intractable migraine headache.  The patient is doing quite well with her seizures, her last seizures were in 1998.  She remains on Dilantin, she tolerates the medication well.  She is on vitamin D supplementation.  She has had a bilateral tubal ligation, with no chance for pregnancy.  She has had an increase in frequency of her headaches since May, normally she may get 10-15 headache days a month, she is now having more than that.  Her headaches oftentimes will last 3 to 4 days at a time.  The patient takes quite a bit of Fioricet, Phenergan may also help her headache.  She drinks 1 cup of coffee a day and one soft drink daily.  The headaches may be associated with nausea and vomiting.  In the past, she has been on amitriptyline but this resulted in weight gain.  She currently is on Topamax which seemed to help initially but it has now has become less effective.  She returns to the office today for an evaluation.  Past Medical History:  Diagnosis Date  . Diabetes mellitus    GDM  diet control  . Seizures (HCC) 1998   last seizure, one of only two in 1998    Past Surgical History:  Procedure Laterality Date  . CHOLECYSTECTOMY    . 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  Social history:  reports that she has never smoked. She has never used smokeless tobacco. She reports that she does not drink alcohol or use drugs.   No Known Allergies  Medications:  Prior to Admission medications   Medication Sig Start Date End Date Taking? Authorizing Provider  Ascorbic Acid (VITAMIN C) 1000 MG tablet Take 1,000 mg by mouth daily.   Yes [provider]  butalbital-acetaminophen-caffeine (FIORICET, ESGIC) 50-325-40 MG tablet TAKE 1 TABLET BY MOUTH EVERY 6 HOURS  AS NEEDED 11/26/17  Yes York SpanielWillis, Charles K, MD  cholecalciferol (VITAMIN D) 1000 UNITS tablet Take 1,000 Units by mouth daily.   Yes [provider]  omeprazole (PRILOSEC) 40 MG capsule Take one capsule by mouth daily before dinner 07/08/18  Yes [provider]  phenytoin (DILANTIN) 100 MG ER capsule Take 2 capsules (200 mg total) by mouth 2 (two) times daily. 12/23/18  Yes York SpanielWillis, Charles K, MD  promethazine (PHENERGAN) 25 MG tablet Take 1 tablet (25 mg total) by mouth every 8 (eight) hours as needed for nausea or vomiting. 04/22/15  Yes Marvel PlanXu, Jindong, MD  SUMAtriptan (IMITREX) 50 MG tablet TAKE 1 TAB AT ONSET OF MIGRAINE. MAY REPEAT IN 2 HOURS IF NEEDED. DO NOT EXCEED 2 TAB IN 1 DAY/ 5WEE 11/26/17  Yes York SpanielWillis, Charles K, MD  topiramate (TOPAMAX) 100 MG tablet TAKE 1/2  TABLET EVERY morning and 1 tab at hs 12/31/17  Yes Nilda RiggsMartin, Nancy Carolyn, NP    ROS:  Out of a complete 14 system review of symptoms, the patient complains only of the following symptoms, and all other reviewed systems are negative.  Headache Nausea  Blood pressure 128/82, pulse 97, temperature (!) 97.1 F (36.2 C), temperature source Temporal, height 5' 3.75" (1.619 m), weight 180 lb 5 oz (81.8 kg).  Physical Exam  General: The patient is alert and cooperative at the  time of the examination.  The patient is moderately obese.  Skin: No significant peripheral edema is noted.   Neurologic Exam  Mental status: The patient is alert and oriented x 3 at the time of the examination. The patient has apparent normal recent and remote memory, with an apparently normal attention span and concentration ability.   Cranial nerves: Facial symmetry is present. Speech is normal, no aphasia or dysarthria is noted. Extraocular movements are full. Visual fields are full.  Motor: The patient has good strength in all 4 extremities.  Sensory examination: Soft touch sensation is symmetric on the face, arms, and legs.   Coordination: The patient has good finger-nose-finger and heel-to-shin bilaterally.  Gait and station: The patient has a normal gait. Tandem gait is normal. Romberg is negative. No drift is seen.  Reflexes: Deep tendon reflexes are symmetric.   Assessment/Plan:  1.  Intractable migraine headache  2.  History of seizures, well controlled  I have indicated the patient that I want her to minimize the use of Fioricet, she is having very frequent headaches and Fioricet has a very high incidence of rebound headache.  Her headaches are long in duration, she will be taken off of the Imitrex and be placed on Amerge for this reason.  The patient will be given a prescription for Phenergan.  We will start propranolol taking 20 mg twice daily for 2 weeks and then go to 40 mg twice daily, she will call for any dose adjustments.  If the propranolol is not tolerated or effective, we may consider Aimovig, in the future if nothing is effective, she may be a candidate for Botox.  She will follow-up here in 6 months.  Once again she is to call for dose adjustments.  She may take Aleve or Advil instead of Fioricet.  Jill Alexanders MD 01/21/2019 9:31 AM  [facility] [address] [place], [place] [address]  Phone [phone] Fax [phone]

## 2019-01-22 LAB — CBC WITH DIFFERENTIAL/PLATELET
Basophils Absolute: 0 10*3/uL (ref 0.0–0.2)
Basos: 0 %
EOS (ABSOLUTE): 0.2 10*3/uL (ref 0.0–0.4)
Eos: 2 %
Hematocrit: 46.8 % — ABNORMAL HIGH (ref 34.0–46.6)
Hemoglobin: 15.6 g/dL (ref 11.1–15.9)
Immature Grans (Abs): 0.1 10*3/uL (ref 0.0–0.1)
Immature Granulocytes: 1 %
Lymphocytes Absolute: 2.7 10*3/uL (ref 0.7–3.1)
Lymphs: 23 %
MCH: 29.5 pg (ref 26.6–33.0)
MCHC: 33.3 g/dL (ref 31.5–35.7)
MCV: 89 fL (ref 79–97)
Monocytes Absolute: 0.9 10*3/uL (ref 0.1–0.9)
Monocytes: 8 %
Neutrophils Absolute: 7.6 10*3/uL — ABNORMAL HIGH (ref 1.4–7.0)
Neutrophils: 66 %
Platelets: 274 10*3/uL (ref 150–450)
RBC: 5.28 x10E6/uL (ref 3.77–5.28)
RDW: 12.5 % (ref 11.7–15.4)
WBC: 11.6 10*3/uL — ABNORMAL HIGH (ref 3.4–10.8)

## 2019-01-22 LAB — COMPREHENSIVE METABOLIC PANEL
ALT: 17 IU/L (ref 0–32)
AST: 17 IU/L (ref 0–40)
Albumin/Globulin Ratio: 1.9 (ref 1.2–2.2)
Albumin: 4.6 g/dL (ref 3.8–4.8)
Alkaline Phosphatase: 59 IU/L (ref 39–117)
BUN/Creatinine Ratio: 15 (ref 9–23)
BUN: 14 mg/dL (ref 6–24)
Bilirubin Total: <0.2 mg/dL (ref 0.0–1.2)
CO2: 23 mmol/L (ref 20–29)
Calcium: 9.2 mg/dL (ref 8.7–10.2)
Chloride: 104 mmol/L (ref 96–106)
Creatinine, Ser: 0.93 mg/dL (ref 0.57–1.00)
GFR calc Af Amer: 87 mL/min/{1.73_m2} (ref >59)
GFR calc non Af Amer: 76 mL/min/{1.73_m2} (ref >59)
Globulin, Total: 2.4 g/dL (ref 1.5–4.5)
Glucose: 85 mg/dL (ref 65–99)
Potassium: 4.8 mmol/L (ref 3.5–5.2)
Sodium: 138 mmol/L (ref 134–144)
Total Protein: 7 g/dL (ref 6.0–8.5)

## 2019-01-22 LAB — PHENYTOIN LEVEL, TOTAL: Phenytoin (Dilantin), Serum: 17.2 ug/mL (ref 10.0–20.0)

## 2019-01-27 ENCOUNTER — Telehealth: Payer: Self-pay

## 2019-01-27 NOTE — Telephone Encounter (Signed)
Patient called to let us know that her pharmacy made her aware that the Amerge requires a quantity limit PA. The insurance number to call is [id number].

## 2019-01-28 NOTE — Telephone Encounter (Signed)
[facility] and [facility]. She reports th pt's insurance will approve 9 tabs for a 30 day supply and that rx is ready for pick up. I contacted the pt and advised of this information and she was agreeable.  [facility].

## 2019-02-04 ENCOUNTER — Other Ambulatory Visit: Payer: Self-pay

## 2019-02-04 MED ORDER — PROPRANOLOL HCL 20 MG PO TABS: ORAL_TABLET | ORAL | 1 refills | Status: DC

## 2019-02-06 ENCOUNTER — Telehealth: Payer: Self-pay | Admitting: Neurology

## 2019-02-06 NOTE — Telephone Encounter (Signed)
Pt has called asking that both her medications now be ran thru [facility], no refill needed at this time.  This is FYI no call back requested

## 2019-02-09 NOTE — Telephone Encounter (Signed)
Noted  

## 2019-06-03 ENCOUNTER — Other Ambulatory Visit: Payer: Self-pay | Admitting: Neurology

## 2019-06-10 ENCOUNTER — Telehealth: Payer: Self-pay

## 2019-06-10 MED ORDER — PHENYTOIN SODIUM EXTENDED 100 MG PO CAPS: 200.0000 mg | ORAL_CAPSULE | Freq: Two times a day (BID) | ORAL | 0 refills | Status: DC

## 2019-06-10 MED ORDER — PROPRANOLOL HCL 20 MG PO TABS: ORAL_TABLET | ORAL | 0 refills | Status: DC

## 2019-06-10 NOTE — Telephone Encounter (Addendum)
I called the number below. It belonged to [facility] who is the patient's pharmacy coverage plan. I was informed the patient must now use [facility] mail order services. This pharmacy has been added to her chart and her prescriptions will be sent to them.

## 2019-06-10 NOTE — Telephone Encounter (Signed)
1) Medication(s) Requested (by name): propranolol (INDERAL) 20 MG tablet  phenytoin (DILANTIN) 100 MG ER capsule   2) Pharmacy [facility] Choice:  [facility]  Phone # [phone]  3.) Patient has a new insurance and will now need her prescriptions sent to this pharmacy

## 2019-07-23 ENCOUNTER — Ambulatory Visit: Payer: 59 | Admitting: Neurology

## 2019-07-28 ENCOUNTER — Other Ambulatory Visit: Payer: Self-pay | Admitting: Neurology

## 2019-09-21 ENCOUNTER — Ambulatory Visit: Payer: 59 | Admitting: Neurology

## 2019-09-23 ENCOUNTER — Other Ambulatory Visit: Payer: Self-pay | Admitting: Neurology

## 2019-09-23 DIAGNOSIS — G43009 Migraine without aura, not intractable, without status migrainosus: Secondary | ICD-10-CM

## 2019-10-16 ENCOUNTER — Other Ambulatory Visit: Payer: Self-pay | Admitting: Neurology

## 2019-10-21 ENCOUNTER — Ambulatory Visit: Payer: 59 | Admitting: Neurology

## 2019-11-01 IMAGING — US US FNA BIOPSY THYROID 1ST LESION
1 series · 13 of 25 positions shown · non-contrast
Comparison: Thyroid ultrasound dated 11/27/2017

MEDICATIONS:
None

COMPLICATIONS:
None immediate.

INDICATION: Patient with history of thyroid mass noted on physical exam and
follow-up thyroid ultrasound on 11/27/2017 which revealed a dominant
2 cm solid and cystic mid isthmic nodule. Request now received for
needle aspiration biopsy of this isthmus nodule.

EXAM:
ULTRASOUND GUIDED FINE NEEDLE ASPIRATION BIOPSY OF MID ISTHMIC
THYROID NODULE
TECHNIQUE: Informed written consent was obtained from the patient after a
discussion of the risks, benefits and alternatives to treatment.
Questions regarding the procedure were encouraged and answered. A
timeout was performed prior to the initiation of the procedure.

[Series 1: us fna biopsy thyroid 1st lesion · 0.06mm/px · 26 acquisitions, 13 frames shown]
[im 1/26]
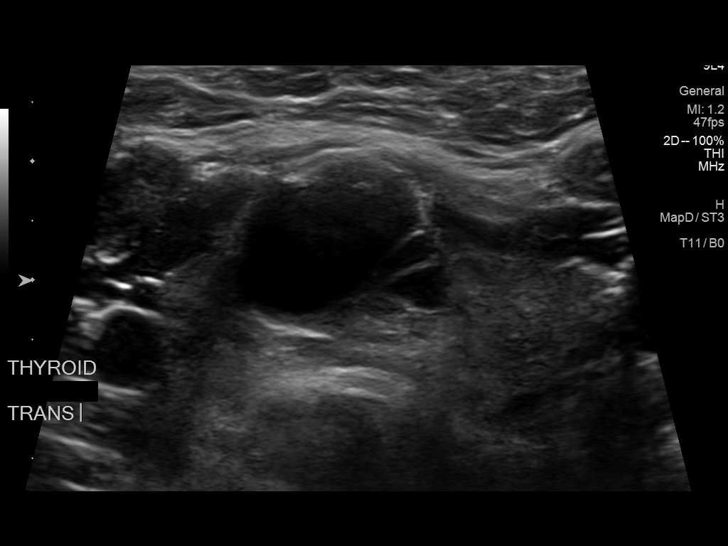
[im 3/26]
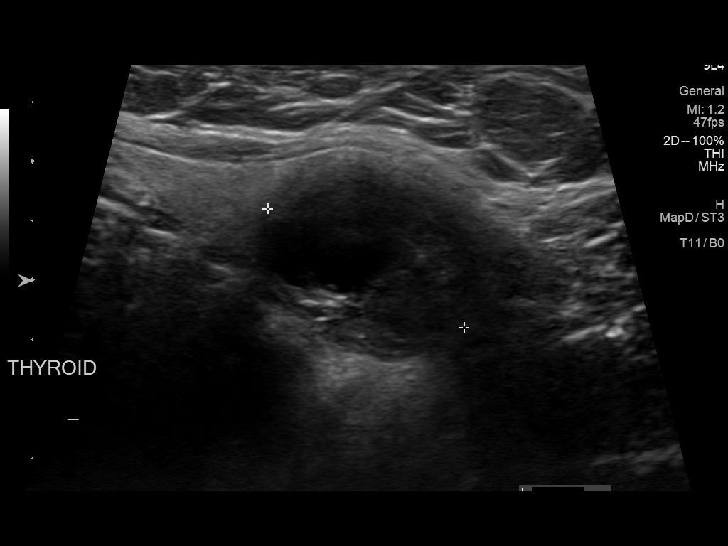
[im 5/26]
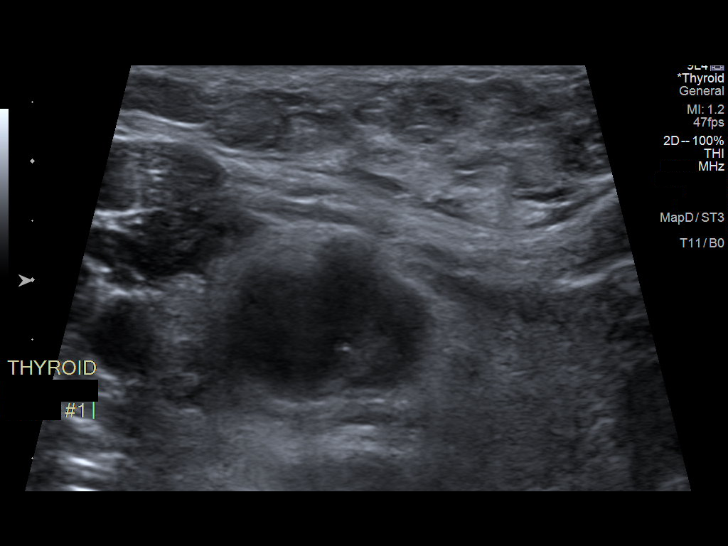
[im 7/26]
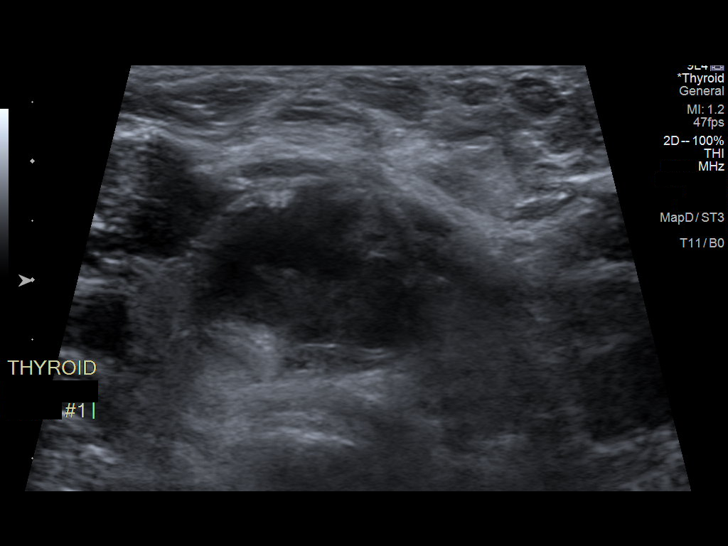
[im 9/26]
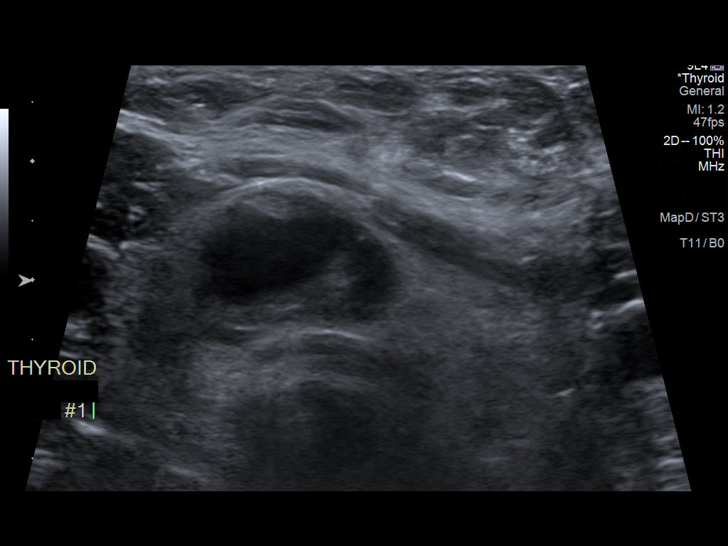
[im 11/26]
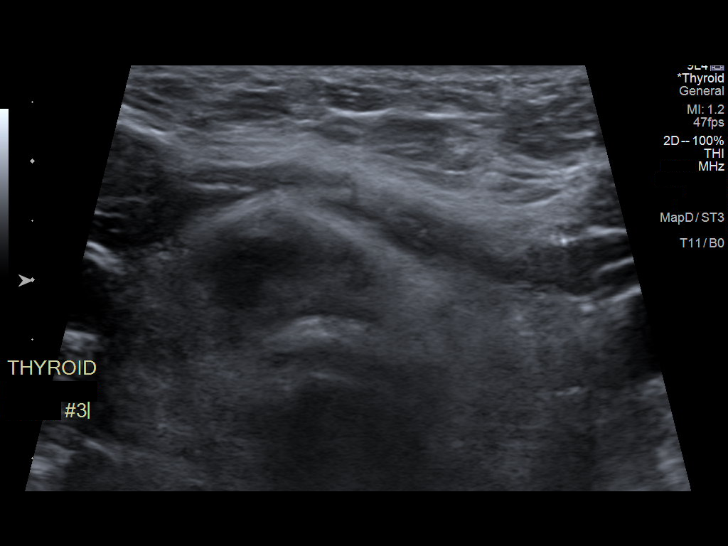
[im 13/26]
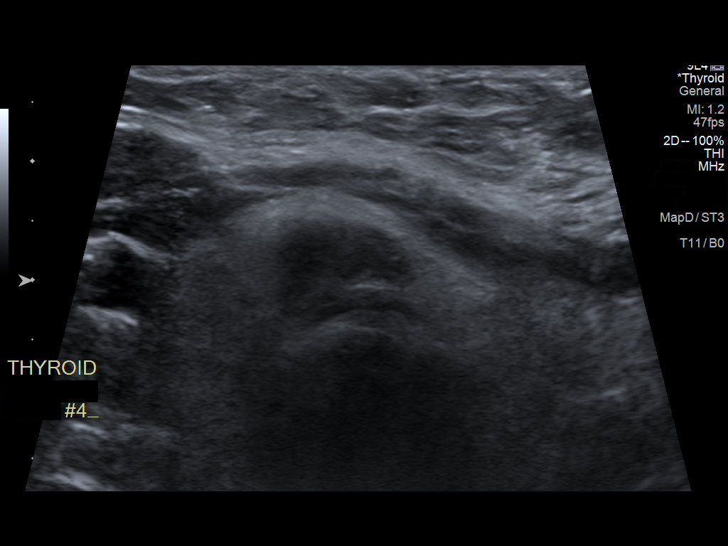
[im 15/26]
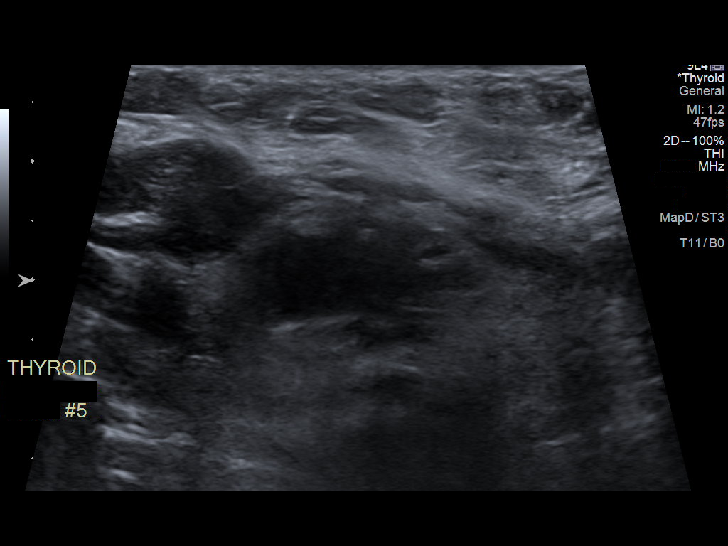
[im 17/26]
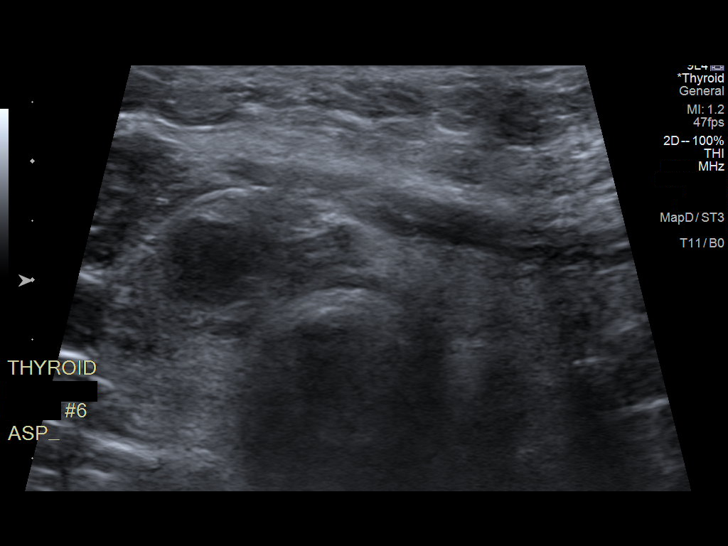
[im 19/26]
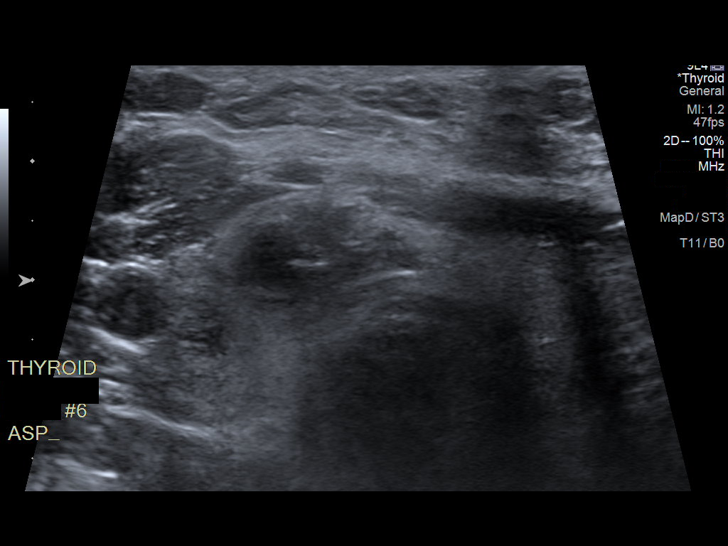
[im 21/26]
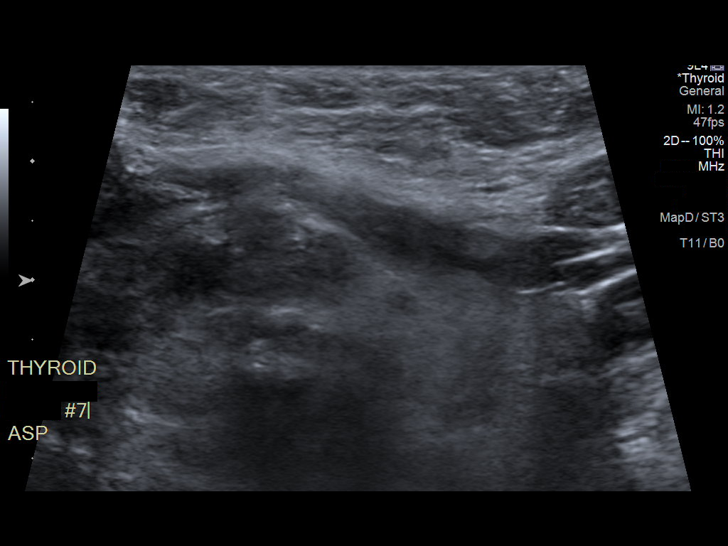
[im 23/26]
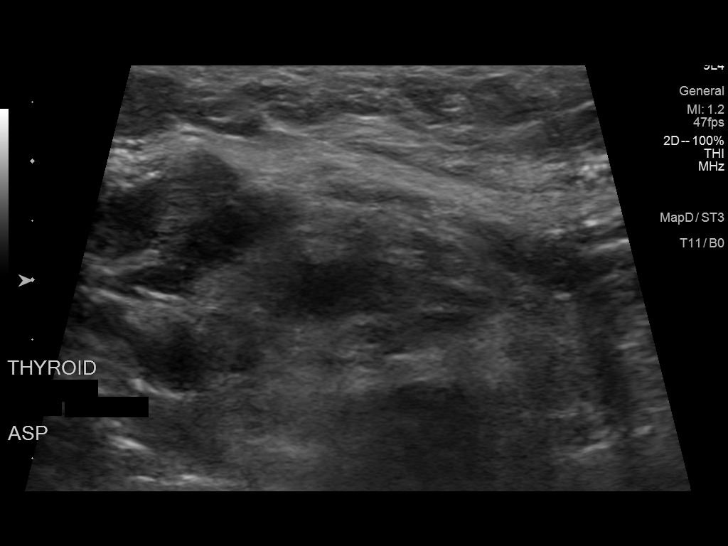
[im 26/26]
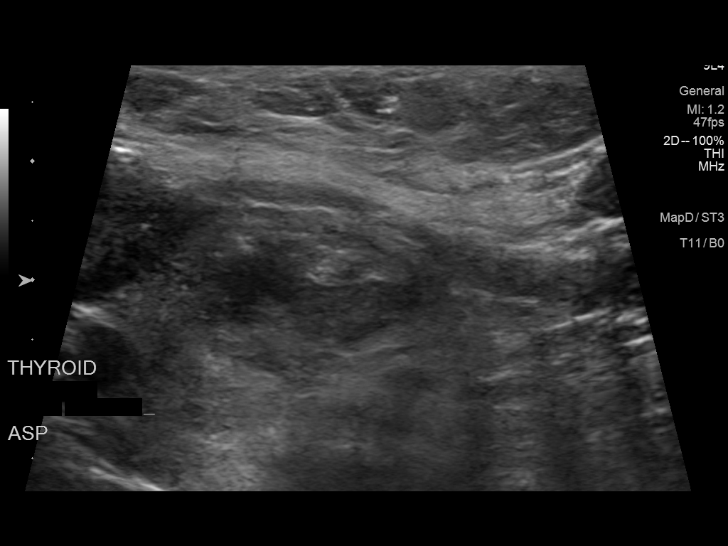

[13 of 25 positions shown; findings below may reference images not displayed]

Pre-procedural ultrasound scanning demonstrated unchanged size and
appearance of the indeterminate nodule within the mid isthmic region

The procedure was planned. The neck was prepped in the usual sterile
fashion, and a sterile drape was applied covering the operative
field. A timeout was performed prior to the initiation of the
procedure. Local anesthesia was provided with 1% lidocaine.

Under direct ultrasound guidance, 7 FNA biopsies were performed of
the mid isthmic nodule with 22/ 25 gauge needles. Multiple
ultrasound images were saved for procedural documentation purposes.
The samples were prepared and submitted to pathology as well as for
Afirma testing. A few cc of reddish yellow fluid were aspirated from
cystic component.

Limited post procedural scanning was negative for hematoma or
additional complication. Dressings were placed. The patient
tolerated the above procedures procedure well without immediate
postprocedural complication.
FINDINGS: Nodule reference number based on prior diagnostic ultrasound: 1

Maximum size: 2.0 cm

Location: Isthmus; Mid

ACR TI-RADS risk category: TR3 (3 points)

Reason for biopsy: patient/referrer request

Ultrasound imaging confirms appropriate placement of the needles
within the isthmic thyroid nodule.
IMPRESSION: Technically successful ultrasound guided fine needle aspiration
biopsy of mid isthmic thyroid nodule. Final pathology pending.

## 2019-11-04 ENCOUNTER — Ambulatory Visit: Payer: 59 | Admitting: Neurology

## 2019-12-04 IMAGING — US US THYROID
1 series · 13 of 25 positions shown · non-contrast
Comparison: None.

CLINICAL DATA: 41-year-old female with a history of thyroid mass on
physical exam

EXAM:
THYROID ULTRASOUND
TECHNIQUE: Ultrasound examination of the thyroid gland and adjacent soft
tissues was performed.

[Series 1: us thyroid · 0.06mm/px · 13 of 52 slices shown]
[im 1/52]
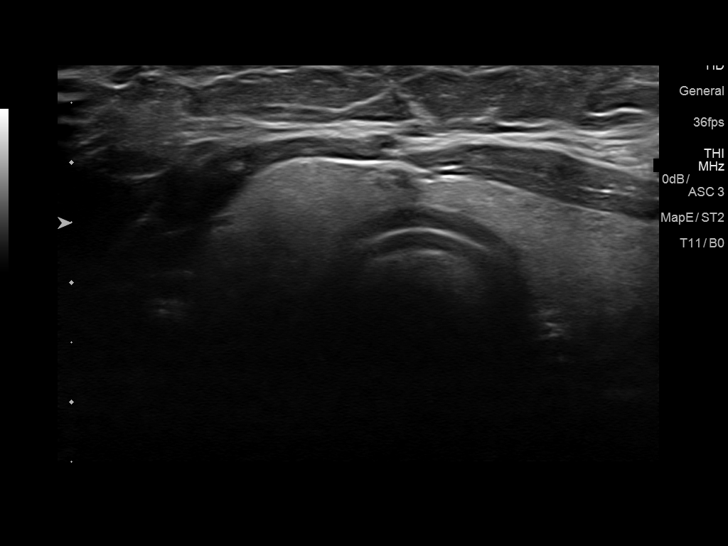
[im 5/52]
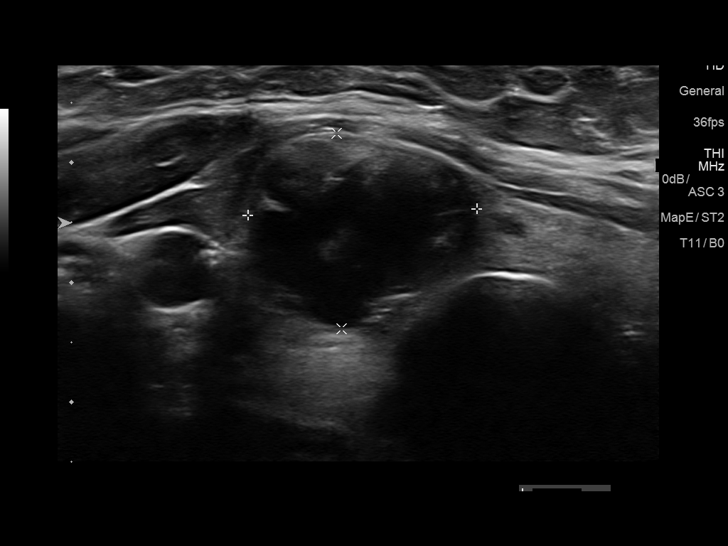
[im 9/52]
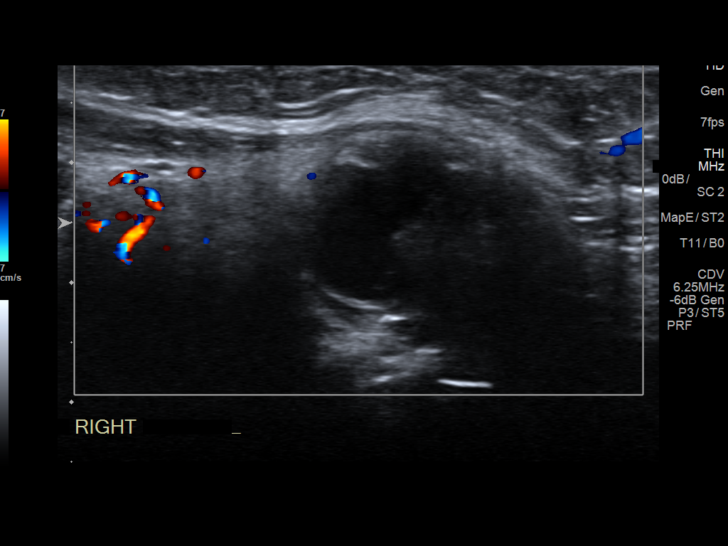
[im 13/52]
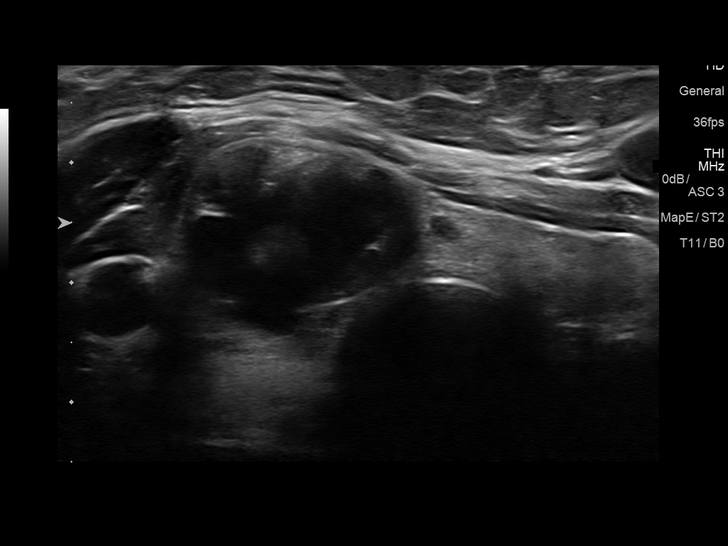
[im 18/52]
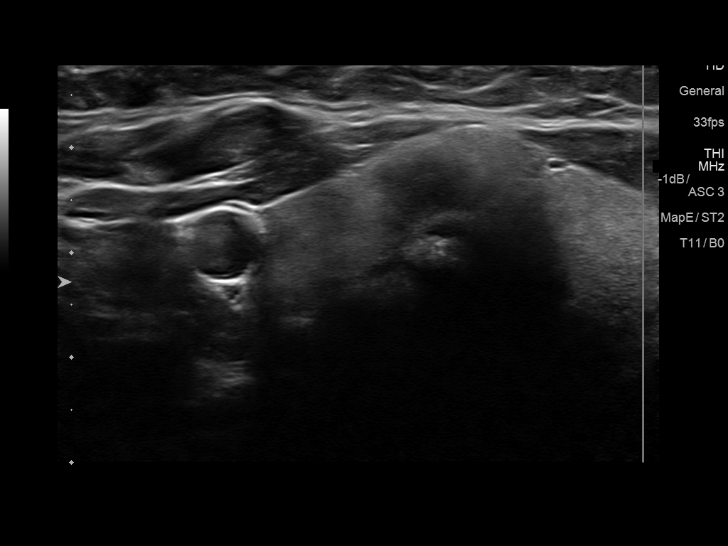
[im 22/52]
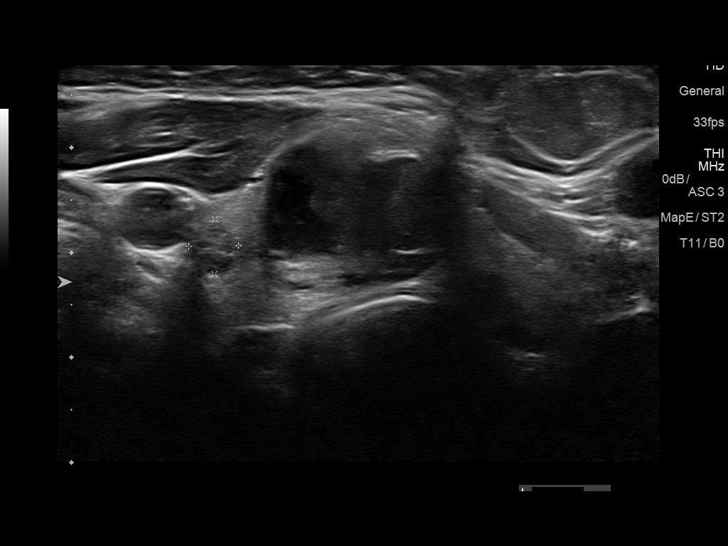
[im 26/52]
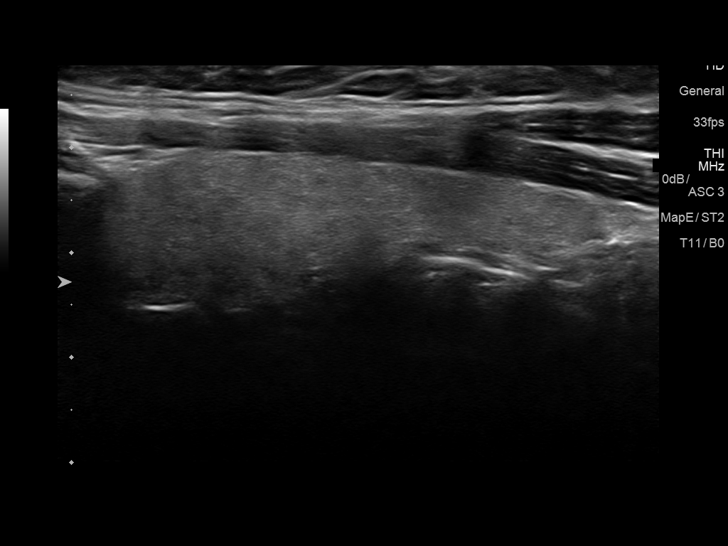
[im 30/52]
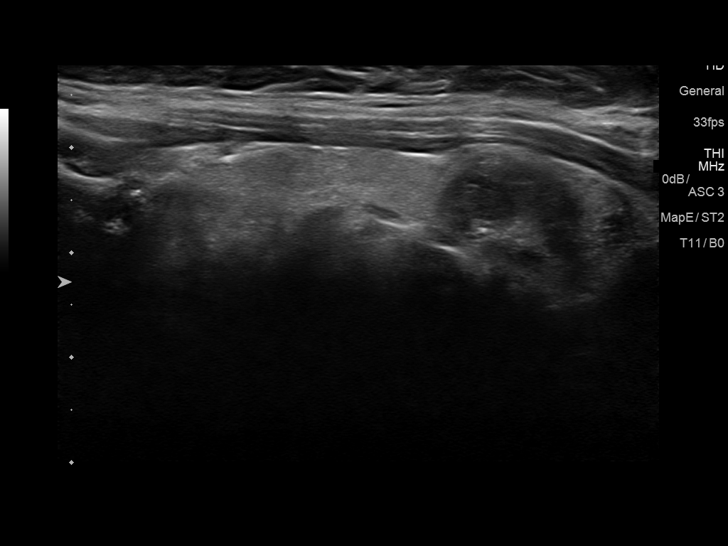
[im 35/52]
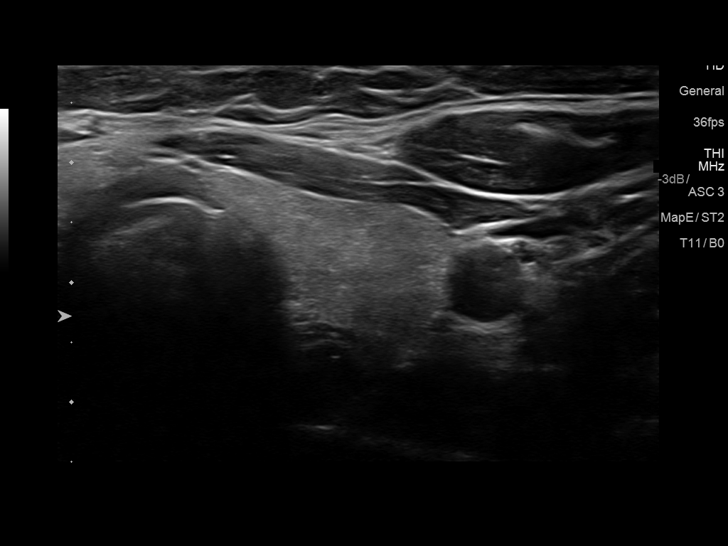
[im 39/52]
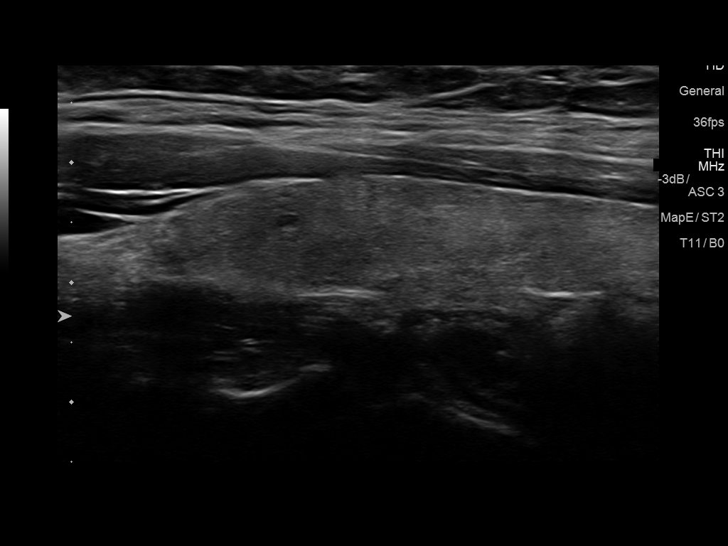
[im 43/52]
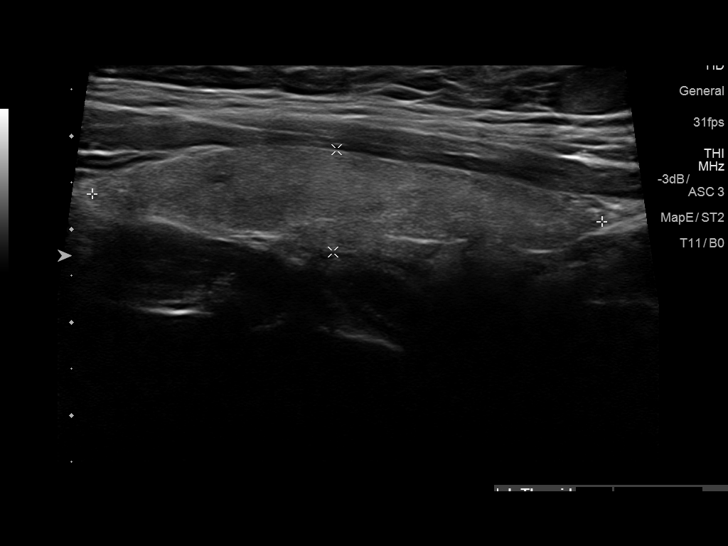
[im 47/52]
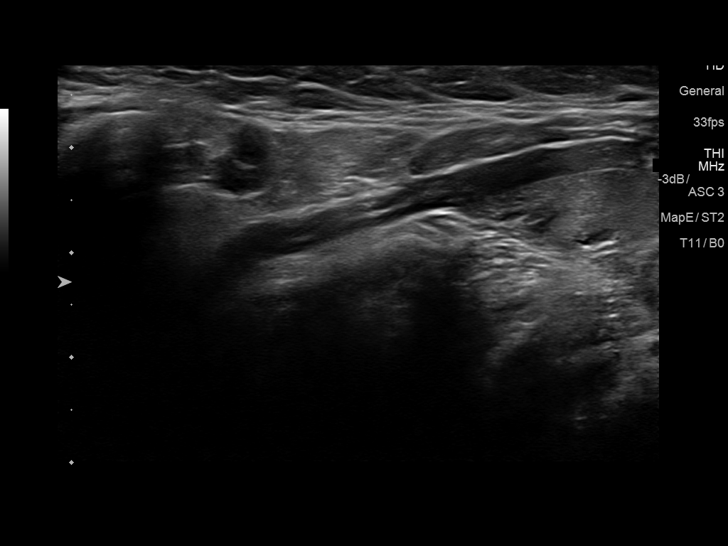
[im 52/52]
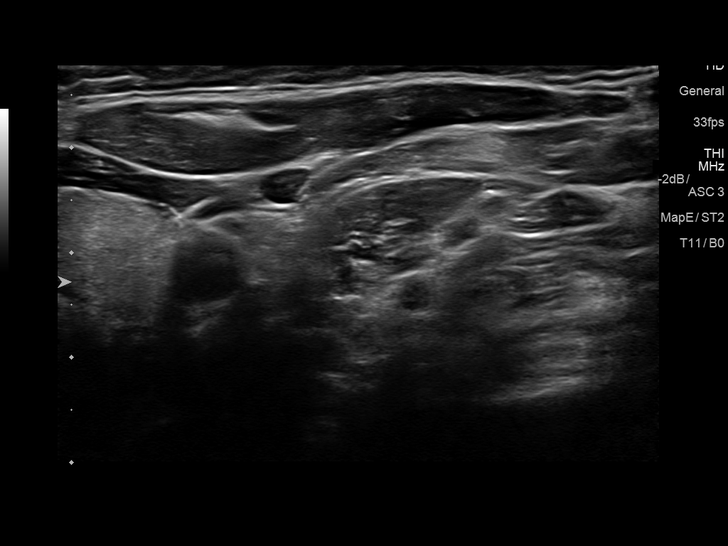

[13 of 25 positions shown; findings below may reference images not displayed]

FINDINGS: Parenchymal Echotexture: Normal

Isthmus: 0.5 cm

Right lobe: 5.2 cm x 1.4 cm x 1.2 cm

Left lobe: 5.5 cm x 1.1 cm x 1.5 cm

_________________________________________________________

Estimated total number of nodules >/= 1 cm: 0

Number of spongiform nodules >/=  2 cm not described below (TR1): 0

Number of mixed cystic and solid nodules >/= 1.5 cm not described
below (TR2): 0

_________________________________________________________

Nodule # 1:

Location: Isthmus; Mid

Maximum size: 2.0 cm; Other 2 dimensions: 1.6 cm x 1.9 cm

Composition: mixed cystic and solid (1)

Echogenicity: hypoechoic (2)

Shape: not taller-than-wide (0)

Margins: ill-defined (0)

Echogenic foci: none (0)

ACR TI-RADS total points: 3.

ACR TI-RADS risk category: TR3 (3 points).

ACR TI-RADS recommendations:

Nodule meets criteria for surveillance

_________________________________________________________

Nodule # 2:

Location: Right; Inferior

Maximum size: 0.6 cm; Other 2 dimensions: 0.5 cm x 0.5 cm

Composition: mixed cystic and solid (1)

Echogenicity: isoechoic (1)

Shape: not taller-than-wide (0)

Margins: smooth (0)

Echogenic foci: none (0)

ACR TI-RADS total points: 2.

ACR TI-RADS risk category: TR2 (2 points).

ACR TI-RADS recommendations:

Nodule does not meet criteria for surveillance or biopsy

_________________________________________________________

No adenopathy
IMPRESSION: Isthmic thyroid nodule (labeled 1) meets criteria for surveillance,
as designated by the newly established ACR TI-RADS criteria.
Surveillance ultrasound study recommended to be performed annually
up to 5 years.

Recommendations follow those established by the new ACR TI-RADS
criteria ([HOSPITAL] 3414;[DATE]).

## 2019-12-14 ENCOUNTER — Encounter: Payer: Self-pay | Admitting: Neurology

## 2019-12-14 ENCOUNTER — Other Ambulatory Visit: Payer: Self-pay

## 2019-12-14 ENCOUNTER — Ambulatory Visit: Payer: Commercial Managed Care - PPO | Admitting: Neurology

## 2019-12-14 VITALS — BP 132/81 | HR 76 | Ht 63.0 in | Wt 155.0 lb

## 2019-12-14 DIAGNOSIS — G43009 Migraine without aura, not intractable, without status migrainosus: Secondary | ICD-10-CM

## 2019-12-14 DIAGNOSIS — G40309 Generalized idiopathic epilepsy and epileptic syndromes, not intractable, without status epilepticus: Secondary | ICD-10-CM

## 2019-12-14 MED ORDER — PROPRANOLOL HCL 20 MG PO TABS: ORAL_TABLET | ORAL | 3 refills | Status: DC

## 2019-12-14 MED ORDER — NARATRIPTAN HCL 2.5 MG PO TABS: ORAL_TABLET | ORAL | 4 refills | Status: DC

## 2019-12-14 MED ORDER — PHENYTOIN SODIUM EXTENDED 100 MG PO CAPS: 200.0000 mg | ORAL_CAPSULE | Freq: Two times a day (BID) | ORAL | 3 refills | Status: DC

## 2019-12-14 NOTE — Patient Instructions (Signed)
Continue current medications Check blood work today See you back in 1 year  

## 2019-12-14 NOTE — Progress Notes (Signed)
PATIENT: Isabella Mcintyre DOB: 1975/10/01  REASON FOR VISIT: follow up HISTORY FROM: patient  HISTORY OF PRESENT ILLNESS: Today 12/14/19  Isabella Mcintyre is a 44 year old female with history of migraine and seizures.  Seizures well-controlled, was last in 1998.  She remains on Dilantin.  When last seen, she was started on propanolol for migraine prevention.  Stop Imitrex, switch to Amerge.  Migraines are much improved, were daily, now 1-2 a week, good benefit with Amerge.  Is allowed about 9 tablets a month.  Will take Phenergan as a substitute if out of Amerge.  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  Overall, satisfied with migraine control.  Tolerating medications. Has 2 girls who play softball, she has to be careful about sun exposure, history of melanoma.  Presents today for evaluation unaccompanied.  HISTORY 01/21/2019 Dr. Anne Hahn: Isabella Mcintyre is a 44 year old right-handed white female with a history of intractable migraine headache.  The patient is doing quite well with her seizures, her last seizures were in 1998.  She remains on Dilantin, she tolerates the medication well.  She is on vitamin D supplementation.  She has had a bilateral tubal ligation, with no chance for pregnancy.  She has had an increase in frequency of her headaches since May, normally she may get 10-15 headache days a month, she is now having more than that.  Her headaches oftentimes will last 3 to 4 days at a time.  The patient takes quite a bit of Fioricet, Phenergan may also help her headache.  She drinks 1 cup of coffee a day and one soft drink daily.  The headaches may be associated with nausea and vomiting.  In the past, she has been on amitriptyline but this resulted in weight gain.  She currently is on Topamax which seemed to help initially but it has now has become less effective.  She returns to the office today for an evaluation.   REVIEW OF SYSTEMS: Out of a complete 14 system  review of symptoms, the patient complains only of the following symptoms, and all other reviewed systems are negative.  Seizure, headache  ALLERGIES: No Known Allergies  HOME MEDICATIONS: Outpatient Medications Prior to Visit  Medication Sig Dispense Refill  . Ascorbic Acid (VITAMIN C) 1000 MG tablet Take 1,000 mg by mouth daily.    . cholecalciferol (VITAMIN D) 1000 UNITS tablet Take 1,000 Units by mouth daily.    Marland Kitchen omeprazole (PRILOSEC) 40 MG capsule Take one capsule by mouth daily before dinner    . promethazine (PHENERGAN) 25 MG tablet TAKE 1 TABLET (25 MG TOTAL) BY MOUTH EVERY 8 (EIGHT) HOURS AS NEEDED FOR NAUSEA OR VOMITING. 30 tablet 3  . butalbital-acetaminophen-caffeine (FIORICET, ESGIC) 50-325-40 MG tablet TAKE 1 TABLET BY MOUTH EVERY 6 HOURS AS NEEDED 30 tablet 1  . naratriptan (AMERGE) 2.5 MG tablet TAKE 1 TAB AT ONSET OF HEADACHE IF RETURNS OR DOESNT RESOLVE, MAY REPEAT AFTER 4 HRS MAX 5MG /24 HRS 9 tablet 4  . phenytoin (DILANTIN) 100 MG ER capsule Take 2 capsules (200 mg total) by mouth 2 (two) times daily. 360 capsule 0  . propranolol (INDERAL) 20 MG tablet ONE TABLET TWICE A DAY FOR 2 WEEKS, THEN TAKE 2 TABLETS TWICE A DAY 360 tablet 0  . topiramate (TOPAMAX) 100 MG tablet TAKE 1/2  TABLET EVERY morning and 1 tab at hs 135 tablet 3   No facility-administered medications prior to visit.    PAST MEDICAL  HISTORY: Past Medical History:  Diagnosis Date  . Diabetes mellitus    GDM  diet control  . Seizures (HCC) 1998   last seizure, one of only two in 1998    PAST SURGICAL HISTORY: Past Surgical History:  Procedure Laterality Date  . CHOLECYSTECTOMY    . 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  SOCIAL HISTORY: Social History   Socioeconomic History  . Marital status: Married    Spouse name: Not on file  . Number of children: Not on file  . Years of education: Not on file  . Highest education  level: Not on file  Occupational History  . Not on file  Tobacco Use  . Smoking status: Never Smoker  . Smokeless tobacco: Never Used  Substance and Sexual Activity  . Alcohol use: No  . Drug use: No  . Sexual activity: Not on file  Other Topics Concern  . Not on file  Social History Narrative  . Not on file   Social Determinants of Health   Financial Resource Strain:   . Difficulty of Paying Living Expenses:   Food Insecurity:   . Worried About Programme researcher, broadcasting/film/video in the Last Year:   . Barista in the Last Year:   Transportation Needs:   . Freight forwarder (Medical):   Marland Kitchen Lack of Transportation (Non-Medical):   Physical Activity:   . Days of Exercise per Week:   . Minutes of Exercise per Session:   Stress:   . Feeling of Stress :   Social Connections:   . Frequency of Communication with Friends and Family:   . Frequency of Social Gatherings with Friends and Family:   . Attends Religious Services:   . Active Member of Clubs or Organizations:   . Attends Banker Meetings:   Marland Kitchen Marital Status:   Intimate Partner Violence:   . Fear of Current or Ex-Partner:   . Emotionally Abused:   Marland Kitchen Physically Abused:   . Sexually Abused:     PHYSICAL EXAM  Vitals:   12/14/19 0813  BP: (!) 132/81  Pulse: 76  Weight: 155 lb (70.3 kg)  Height: 5\' 3"  (1.6 m)   Body mass index is 27.46 kg/m.  Generalized: Well developed, in no acute distress   Neurological examination  Mentation: Alert oriented to time, place, history taking. Follows all commands speech and language fluent Cranial nerve II-XII: Pupils were equal round reactive to light. Extraocular movements were full, visual field were full on confrontational test. Facial sensation and strength were normal.  Head turning and shoulder shrug  were normal and symmetric. Motor: The motor testing reveals 5 over 5 strength of all 4 extremities. Good symmetric motor tone is noted throughout.  Sensory: Sensory  testing is intact to soft touch on all 4 extremities. No evidence of extinction is noted.  Coordination: Cerebellar testing reveals good finger-nose-finger and heel-to-shin bilaterally.  Gait and station: Gait is normal. Tandem gait is normal.  Reflexes: Deep tendon reflexes are symmetric and normal bilaterally.   DIAGNOSTIC DATA (LABS, IMAGING, TESTING) - I reviewed patient records, labs, notes, testing and imaging myself where available.  Lab Results  Component Value Date   WBC 11.6 (H) 01/21/2019   HGB 15.6 01/21/2019   HCT 46.8 (H) 01/21/2019   MCV 89 01/21/2019   PLT 274 01/21/2019      Component Value Date/Time  NA 138 01/21/2019 0949   K 4.8 01/21/2019 0949   CL 104 01/21/2019 0949   CO2 23 01/21/2019 0949   GLUCOSE 85 01/21/2019 0949   GLUCOSE 123 (H) 10/18/2011 1410   BUN 14 01/21/2019 0949   CREATININE 0.93 01/21/2019 0949   CALCIUM 9.2 01/21/2019 0949   PROT 7.0 01/21/2019 0949   ALBUMIN 4.6 01/21/2019 0949   AST 17 01/21/2019 0949   ALT 17 01/21/2019 0949   ALKPHOS 59 01/21/2019 0949   BILITOT <0.2 01/21/2019 0949   GFRNONAA 76 01/21/2019 0949   GFRAA 87 01/21/2019 0949   No results found for: CHOL, HDL, LDLCALC, LDLDIRECT, TRIG, CHOLHDL No results found for: WUJW1X No results found for: VITAMINB12 No results found for: TSH   ASSESSMENT AND PLAN 44 y.o. year old female  has a past medical history of Diabetes mellitus and Seizures (HCC) (1998). here with:  1.  History of seizures, well controlled -Continue Dilantin 100 mg ER, 2 capsules twice daily -Check routine blood work today -Call for recurrent seizure  2.  Migraine headache -Much improved, on average 1-2 a week, talked about CGRP, wants to stay on current medications -Continue propanolol 40 mg twice a day -Continue Amerge as needed for acute headache -Will contact me if headaches increase, would consider CGRP -Follow-up 1 year or sooner if needed  I spent 30 minutes of face-to-face and  non-face-to-face time with patient.  This included previsit chart review, lab review, study review, order entry, electronic health record documentation, patient education.  Margie Ege, AGNP-C, DNP 12/14/2019, 9:19 AM [facility] [address] [place], [place] [address] [phone]

## 2019-12-14 NOTE — Progress Notes (Signed)
I have read the note, and I agree with the clinical assessment and plan.  Bowdy Bair K Deklynn Charlet   

## 2019-12-15 ENCOUNTER — Encounter: Payer: Self-pay | Admitting: Neurology

## 2019-12-15 ENCOUNTER — Other Ambulatory Visit: Payer: Self-pay | Admitting: Neurology

## 2019-12-15 DIAGNOSIS — G40309 Generalized idiopathic epilepsy and epileptic syndromes, not intractable, without status epilepticus: Secondary | ICD-10-CM

## 2019-12-15 LAB — CBC WITH DIFFERENTIAL/PLATELET
Basophils Absolute: 0 10*3/uL (ref 0.0–0.2)
Basos: 1 %
EOS (ABSOLUTE): 0.2 10*3/uL (ref 0.0–0.4)
Eos: 2 %
Hematocrit: 43.2 % (ref 34.0–46.6)
Hemoglobin: 14.6 g/dL (ref 11.1–15.9)
Immature Grans (Abs): 0 10*3/uL (ref 0.0–0.1)
Immature Granulocytes: 0 %
Lymphocytes Absolute: 1.8 10*3/uL (ref 0.7–3.1)
Lymphs: 21 %
MCH: 30.8 pg (ref 26.6–33.0)
MCHC: 33.8 g/dL (ref 31.5–35.7)
MCV: 91 fL (ref 79–97)
Monocytes Absolute: 0.6 10*3/uL (ref 0.1–0.9)
Monocytes: 8 %
Neutrophils Absolute: 5.7 10*3/uL (ref 1.4–7.0)
Neutrophils: 68 %
Platelets: 227 10*3/uL (ref 150–450)
RBC: 4.74 x10E6/uL (ref 3.77–5.28)
RDW: 12.4 % (ref 11.7–15.4)
WBC: 8.3 10*3/uL (ref 3.4–10.8)

## 2019-12-15 LAB — COMPREHENSIVE METABOLIC PANEL
ALT: 16 IU/L (ref 0–32)
AST: 10 IU/L (ref 0–40)
Albumin/Globulin Ratio: 2.1 (ref 1.2–2.2)
Albumin: 4.4 g/dL (ref 3.8–4.8)
Alkaline Phosphatase: 54 IU/L (ref 48–121)
BUN/Creatinine Ratio: 16 (ref 9–23)
BUN: 13 mg/dL (ref 6–24)
Bilirubin Total: 0.2 mg/dL (ref 0.0–1.2)
CO2: 21 mmol/L (ref 20–29)
Calcium: 8.6 mg/dL — ABNORMAL LOW (ref 8.7–10.2)
Chloride: 106 mmol/L (ref 96–106)
Creatinine, Ser: 0.83 mg/dL (ref 0.57–1.00)
GFR calc Af Amer: 99 mL/min/{1.73_m2} (ref >59)
GFR calc non Af Amer: 86 mL/min/{1.73_m2} (ref >59)
Globulin, Total: 2.1 g/dL (ref 1.5–4.5)
Glucose: 91 mg/dL (ref 65–99)
Potassium: 4.2 mmol/L (ref 3.5–5.2)
Sodium: 140 mmol/L (ref 134–144)
Total Protein: 6.5 g/dL (ref 6.0–8.5)

## 2019-12-15 LAB — PHENYTOIN LEVEL, TOTAL: Phenytoin (Dilantin), Serum: 21.6 ug/mL (ref 10.0–20.0)

## 2019-12-16 ENCOUNTER — Telehealth: Payer: Self-pay | Admitting: *Deleted

## 2019-12-16 ENCOUNTER — Telehealth: Payer: Self-pay

## 2019-12-16 ENCOUNTER — Other Ambulatory Visit: Payer: Self-pay | Admitting: Neurology

## 2019-12-16 MED ORDER — PHENYTOIN SODIUM EXTENDED 100 MG PO CAPS: 200.0000 mg | ORAL_CAPSULE | Freq: Two times a day (BID) | ORAL | 3 refills | Status: DC

## 2019-12-16 NOTE — Telephone Encounter (Signed)
Attempted to call the patient without success. ° °LM on the VM for the patient to call back re: recent results. ° °**If the patient calls back please relay the message below from Sarah Slack NP °

## 2019-12-16 NOTE — Telephone Encounter (Signed)
-----   Message from Glean Salvo, NP sent at 12/15/2019  2:51 PM EDT ----- Labs look overall good, but dilantin level is high 21.6, have her come back in for trough level (before taking morning meds). I will place the order.

## 2019-12-16 NOTE — Telephone Encounter (Signed)
Pt called back, read Pt Labs results as instructed. Informed Pt to come in for blood work before she takes her morning meds.

## 2019-12-16 NOTE — Telephone Encounter (Signed)
Dilantin Rx sent to [facility] per patient's request.

## 2019-12-16 NOTE — Telephone Encounter (Signed)
Noted  

## 2019-12-21 ENCOUNTER — Ambulatory Visit: Payer: 59 | Admitting: Neurology

## 2020-02-27 ENCOUNTER — Other Ambulatory Visit: Payer: Self-pay | Admitting: Neurology

## 2020-02-29 ENCOUNTER — Telehealth: Payer: Self-pay | Admitting: Neurology

## 2020-02-29 NOTE — Telephone Encounter (Signed)
Pt request refill propranolol (INDERAL) 20 MG tablet at  [id number]

## 2020-02-29 NOTE — Telephone Encounter (Signed)
Refilled

## 2020-03-01 ENCOUNTER — Other Ambulatory Visit: Payer: Self-pay | Admitting: Neurology

## 2020-03-02 ENCOUNTER — Other Ambulatory Visit: Payer: Self-pay | Admitting: Neurology

## 2020-03-07 ENCOUNTER — Telehealth: Payer: Self-pay | Admitting: Neurology

## 2020-03-07 MED ORDER — PHENYTOIN SODIUM EXTENDED 100 MG PO CAPS: 200.0000 mg | ORAL_CAPSULE | Freq: Two times a day (BID) | ORAL | 0 refills | Status: DC

## 2020-03-07 NOTE — Telephone Encounter (Signed)
I called pt and she is needing 90 day supply to go to her [facility] mail order so her insurance will pay.  I relayed that will do for 90 day and then in January this may change again.  She was ok with this.

## 2020-03-07 NOTE — Telephone Encounter (Signed)
Pt called stating that her phenytoin (DILANTIN) 100 MG ER capsule is to be sent to the [facility]. The medication refill request was sent to the wrong pharmacy and it was denied by the insurance.

## 2020-07-25 ENCOUNTER — Other Ambulatory Visit: Payer: Self-pay | Admitting: Neurology

## 2020-07-25 DIAGNOSIS — G43009 Migraine without aura, not intractable, without status migrainosus: Secondary | ICD-10-CM

## 2020-08-08 ENCOUNTER — Other Ambulatory Visit: Payer: Self-pay | Admitting: Neurology

## 2020-12-12 NOTE — Progress Notes (Deleted)
PATIENT: Isabella Mcintyre DOB: 14-Jun-1975  REASON FOR VISIT: follow up HISTORY FROM: patient  HISTORY OF PRESENT ILLNESS: Today 12/12/20  Isabella Mcintyre here today for follow-up for migraine and seizures.  Well-controlled on Dilantin.  On propanolol for migraine prevention, Amerge PRN.  Update 12/14/2019 SS: Isabella Mcintyre is a 45 year old female with history of migraine and seizures.  Seizures well-controlled, was last in 1998.  She remains on Dilantin.  When last seen, she was started on propanolol for migraine prevention.  Stop Imitrex, switch to Amerge.  Migraines are much improved, were daily, now 1-2 a week, good benefit with Amerge.  Is allowed about 9 tablets a month.  Will take Phenergan as a substitute if out of Amerge.  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  Overall, satisfied with migraine control.  Tolerating medications. Has 2 girls who play softball, she has to be careful about sun exposure, history of melanoma.  Presents today for evaluation unaccompanied.  HISTORY 01/21/2019 Dr. Anne Hahn: Isabella Mcintyre is a 45 year old right-handed white female with a history of intractable migraine headache.  The patient is doing quite well with her seizures, her last seizures were in 1998.  She remains on Dilantin, she tolerates the medication well.  She is on vitamin D supplementation.  She has had a bilateral tubal ligation, with no chance for pregnancy.  She has had an increase in frequency of her headaches since May, normally she may get 10-15 headache days a month, she is now having more than that.  Her headaches oftentimes will last 3 to 4 days at a time.  The patient takes quite a bit of Fioricet, Phenergan may also help her headache.  She drinks 1 cup of coffee a day and one soft drink daily.  The headaches may be associated with nausea and vomiting.  In the past, she has been on amitriptyline but this resulted in weight gain.  She currently is on Topamax  which seemed to help initially but it has now has become less effective.  She returns to the office today for an evaluation.   REVIEW OF SYSTEMS: Out of a complete 14 system review of symptoms, the patient complains only of the following symptoms, and all other reviewed systems are negative.  Seizure, headache  ALLERGIES: No Known Allergies  HOME MEDICATIONS: Outpatient Medications Prior to Visit  Medication Sig Dispense Refill   Ascorbic Acid (VITAMIN C) 1000 MG tablet Take 1,000 mg by mouth daily.     cholecalciferol (VITAMIN D) 1000 UNITS tablet Take 1,000 Units by mouth daily.     naratriptan (AMERGE) 2.5 MG tablet TAKE 1 TAB AT ONSET OF HEADACHE IF RETURNS OR DOESNT RESOLVE, MAY REPEAT AFTER 4 HRS MAX 5MG /24 HRS 9 tablet 4   omeprazole (PRILOSEC) 40 MG capsule Take one capsule by mouth daily before dinner     phenytoin (DILANTIN) 100 MG ER capsule Take 2 capsules by mouth twice daily 360 capsule 1   promethazine (PHENERGAN) 25 MG tablet TAKE 1 TABLET (25 MG TOTAL) BY MOUTH EVERY 8 (EIGHT) HOURS AS NEEDED FOR NAUSEA OR VOMITING. 30 tablet 3   propranolol (INDERAL) 20 MG tablet Take 2 tablets by mouth twice daily 360 tablet 3   No facility-administered medications prior to visit.    PAST MEDICAL HISTORY: Past Medical History:  Diagnosis Date   Diabetes mellitus    GDM  diet control   Seizures (HCC) 1998   last seizure, one of  only two in 1998    PAST SURGICAL HISTORY: Past Surgical History:  Procedure Laterality Date   CHOLECYSTECTOMY     FOOT TUMOR EXCISION  June  2006   Melanoma from ankle    FAMILY HISTORY: No family history on file.  SOCIAL HISTORY: Social History   Socioeconomic History   Marital status: Married    Spouse name: Not on file   Number of children: Not on file   Years of education: Not on file   Highest education level: Not on file  Occupational History   Not on file  Tobacco Use   Smoking status: Never   Smokeless tobacco: Never   Substance and Sexual Activity   Alcohol use: No   Drug use: No   Sexual activity: Not on file  Other Topics Concern   Not on file  Social History Narrative   Not on file   Social Determinants of Health   Financial Resource Strain: Not on file  Food Insecurity: Not on file  Transportation Needs: Not on file  Physical Activity: Not on file  Stress: Not on file  Social Connections: Not on file  Intimate Partner Violence: Not on file    PHYSICAL EXAM  There were no vitals filed for this visit.  There is no height or weight on file to calculate BMI.  Generalized: Well developed, in no acute distress   Neurological examination  Mentation: Alert oriented to time, place, history taking. Follows all commands speech and language fluent Cranial nerve II-XII: Pupils were equal round reactive to light. Extraocular movements were full, visual field were full on confrontational test. Facial sensation and strength were normal.  Head turning and shoulder shrug  were normal and symmetric. Motor: The motor testing reveals 5 over 5 strength of all 4 extremities. Good symmetric motor tone is noted throughout.  Sensory: Sensory testing is intact to soft touch on all 4 extremities. No evidence of extinction is noted.  Coordination: Cerebellar testing reveals good finger-nose-finger and heel-to-shin bilaterally.  Gait and station: Gait is normal. Tandem gait is normal.  Reflexes: Deep tendon reflexes are symmetric and normal bilaterally.   DIAGNOSTIC DATA (LABS, IMAGING, TESTING) - I reviewed patient records, labs, notes, testing and imaging myself where available.  Lab Results  Component Value Date   WBC 8.3 12/14/2019   HGB 14.6 12/14/2019   HCT 43.2 12/14/2019   MCV 91 12/14/2019   PLT 227 12/14/2019      Component Value Date/Time   NA 140 12/14/2019 0845   K 4.2 12/14/2019 0845   CL 106 12/14/2019 0845   CO2 21 12/14/2019 0845   GLUCOSE 91 12/14/2019 0845   GLUCOSE 123 (H)  10/18/2011 1410   BUN 13 12/14/2019 0845   CREATININE 0.83 12/14/2019 0845   CALCIUM 8.6 (L) 12/14/2019 0845   PROT 6.5 12/14/2019 0845   ALBUMIN 4.4 12/14/2019 0845   AST 10 12/14/2019 0845   ALT 16 12/14/2019 0845   ALKPHOS 54 12/14/2019 0845   BILITOT 0.2 12/14/2019 0845   GFRNONAA 86 12/14/2019 0845   GFRAA 99 12/14/2019 0845   No results found for: CHOL, HDL, LDLCALC, LDLDIRECT, TRIG, CHOLHDL No results found for: OVZC5Y No results found for: VITAMINB12 No results found for: TSH   ASSESSMENT AND PLAN 45 y.o. year old female  has a past medical history of Diabetes mellitus and Seizures (HCC) (1998). here with:  1.  History of seizures, well controlled -Continue Dilantin 100 mg ER, 2 capsules twice daily -  Check routine blood work today -Call for recurrent seizure  2.  Migraine headache -Much improved, on average 1-2 a week, talked about CGRP, wants to stay on current medications -Continue propanolol 40 mg twice a day -Continue Amerge as needed for acute headache -Will contact me if headaches increase, would consider CGRP -Follow-up 1 year or sooner if needed  I spent 30 minutes of face-to-face and non-face-to-face time with patient.  This included previsit chart review, lab review, study review, order entry, electronic health record documentation, patient education.  Margie Ege, AGNP-C, DNP 12/12/2020, 1:08 PM [facility] [address] [place], [place] [address] [phone]

## 2020-12-13 ENCOUNTER — Ambulatory Visit: Payer: Commercial Managed Care - PPO | Admitting: Neurology

## 2021-01-11 ENCOUNTER — Other Ambulatory Visit: Payer: Self-pay | Admitting: Neurology

## 2021-01-12 ENCOUNTER — Other Ambulatory Visit: Payer: Self-pay

## 2021-01-12 ENCOUNTER — Ambulatory Visit: Payer: Commercial Managed Care - PPO | Admitting: Neurology

## 2021-01-12 ENCOUNTER — Encounter: Payer: Self-pay | Admitting: Neurology

## 2021-01-12 VITALS — BP 121/82 | HR 70 | Ht 64.0 in | Wt 180.0 lb

## 2021-01-12 DIAGNOSIS — G40309 Generalized idiopathic epilepsy and epileptic syndromes, not intractable, without status epilepticus: Secondary | ICD-10-CM | POA: Diagnosis not present

## 2021-01-12 DIAGNOSIS — Z5181 Encounter for therapeutic drug level monitoring: Secondary | ICD-10-CM

## 2021-01-12 DIAGNOSIS — G43009 Migraine without aura, not intractable, without status migrainosus: Secondary | ICD-10-CM

## 2021-01-12 MED ORDER — PROPRANOLOL HCL ER 120 MG PO CP24: 120.0000 mg | ORAL_CAPSULE | Freq: Every day | ORAL | 3 refills | Status: DC

## 2021-01-12 MED ORDER — PHENYTOIN SODIUM EXTENDED 100 MG PO CAPS: 200.0000 mg | ORAL_CAPSULE | Freq: Two times a day (BID) | ORAL | 3 refills | Status: DC

## 2021-01-12 MED ORDER — NARATRIPTAN HCL 2.5 MG PO TABS: ORAL_TABLET | ORAL | 4 refills | Status: DC

## 2021-01-12 NOTE — Progress Notes (Signed)
Reason for visit: Headache, history of seizures  Isabella Mcintyre is an 45 y.o. female  History of present illness:  Isabella Mcintyre is a 45 year old right-handed white female with a history of seizures and migraine headache.  The patient is doing quite well on Dilantin with her seizures, her last seizure event was in 1998.  The patient works as a Librarian, academic currently.  She has migraine headaches that generally occur once or twice a week, she may have up to 8 headache days a month.  Prior to going on propranolol, she was having 10 to 15 days with headache a month.  The patient may have some occasional nausea or vomiting with the headache.  She does not miss work.  In the past she has been on Topamax.  The patient tolerates propranolol fairly well, she denies any significant fatigue or depression issues.  The patient is on vitamin D supplementation with her Dilantin.  She returns for further evaluation.  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 No pertinent family history.  Social history:  reports that she has never smoked. She has never used smokeless tobacco. She reports that she does not drink alcohol and does not use drugs.   No Known Allergies  Medications:  Prior to Admission medications   Medication Sig Start Date End Date Taking? Authorizing Provider  Ascorbic Acid (VITAMIN C) 1000 MG tablet Take 1,000 mg by mouth daily.   Yes Provider, Historical, Mcintyre  cholecalciferol (VITAMIN D) 1000 UNITS tablet Take 1,000 Units by mouth daily.   Yes Provider, Historical, Mcintyre  naratriptan (AMERGE) 2.5 MG tablet TAKE 1 TAB AT ONSET OF HEADACHE IF RETURNS OR DOESNT RESOLVE, MAY REPEAT AFTER 4 HRS MAX 5MG /24 HRS 12/14/19  Yes 12/16/19, NP  omeprazole (PRILOSEC) 40 MG capsule Take one capsule by mouth daily before dinner 07/08/18  Yes Provider, Historical, Mcintyre  phenytoin (DILANTIN) 100 MG ER capsule Take 2 capsules by mouth twice daily 08/08/20  Yes 08/10/20, NP  promethazine (PHENERGAN) 25 MG tablet TAKE 1 TABLET (25 MG TOTAL) BY MOUTH EVERY 8 (EIGHT) HOURS AS NEEDED FOR NAUSEA OR VOMITING. 07/26/20  Yes 09/25/20, Mcintyre  propranolol (INDERAL) 20 MG tablet Take 2 tablets by mouth twice daily 02/29/20  Yes 04/30/20, NP    ROS:  Out of a complete 14 system review of symptoms, the patient complains only of the following symptoms, and all other reviewed systems are negative.  Headache Nausea  Blood pressure 121/82, pulse 70, height 5\' 4"  (1.626 m), weight 180 lb (81.6 kg).  Physical Exam  General: The patient is alert and cooperative at the time of the examination.  The patient is moderately obese.  Skin: No significant peripheral edema is noted.   Neurologic Exam  Mental status: The patient is alert and oriented x 3 at the time of the examination. The patient has apparent normal recent and remote memory, with an apparently normal attention span and concentration ability.   Cranial nerves: Facial symmetry is present. Speech is normal, no aphasia or dysarthria is noted. Extraocular movements are full. Visual fields are full.  Motor: The patient has good strength in all 4 extremities.  Sensory examination: Soft touch sensation is symmetric on the face, arms, and legs.  Coordination: The patient has good finger-nose-finger and heel-to-shin bilaterally.  Gait and station: The patient has a normal gait. Tandem gait is minimally unsteady. Romberg is negative. No drift is seen.  Reflexes: Deep tendon reflexes are symmetric.   Assessment/Plan:  1.  Migraine headache, intractable  2.  History of seizures, well controlled  The patient continues to have fairly frequent headache.  The Amerge does help  when she takes it.  We will go up on the propranolol to the 120 mg LA capsule taking 1 daily.  A prescription was sent in.  She will continue her Dilantin, blood work will be done today.  A prescription for Amerge was sent in.  She will follow-up here in 1 year, sooner if needed.  In the future, she can be followed through Dr. Delena Mcintyre.  At some point in the future, the patient may be amenable to trying to taper off of the Dilantin to see if she continues to have ongoing need for seizure medications.  Her last seizure was when she was in her early 48s.  Isabella Isabella Mcintyre 01/12/2021 9:05 AM  [facility] [address] [place], [place] [address]  Phone [phone] Fax [phone]

## 2021-01-13 LAB — CBC WITH DIFFERENTIAL/PLATELET
Basophils Absolute: 0 10*3/uL (ref 0.0–0.2)
Basos: 1 %
EOS (ABSOLUTE): 0.2 10*3/uL (ref 0.0–0.4)
Eos: 3 %
Hematocrit: 45.9 % (ref 34.0–46.6)
Hemoglobin: 15.5 g/dL (ref 11.1–15.9)
Immature Grans (Abs): 0 10*3/uL (ref 0.0–0.1)
Immature Granulocytes: 1 %
Lymphocytes Absolute: 2.2 10*3/uL (ref 0.7–3.1)
Lymphs: 25 %
MCH: 30.1 pg (ref 26.6–33.0)
MCHC: 33.8 g/dL (ref 31.5–35.7)
MCV: 89 fL (ref 79–97)
Monocytes Absolute: 0.7 10*3/uL (ref 0.1–0.9)
Monocytes: 8 %
Neutrophils Absolute: 5.4 10*3/uL (ref 1.4–7.0)
Neutrophils: 62 %
Platelets: 222 10*3/uL (ref 150–450)
RBC: 5.15 x10E6/uL (ref 3.77–5.28)
RDW: 12.6 % (ref 11.7–15.4)
WBC: 8.6 10*3/uL (ref 3.4–10.8)

## 2021-01-13 LAB — COMPREHENSIVE METABOLIC PANEL
ALT: 15 IU/L (ref 0–32)
AST: 18 IU/L (ref 0–40)
Albumin/Globulin Ratio: 2.1 (ref 1.2–2.2)
Albumin: 4.7 g/dL (ref 3.8–4.8)
Alkaline Phosphatase: 53 IU/L (ref 44–121)
BUN/Creatinine Ratio: 18 (ref 9–23)
BUN: 16 mg/dL (ref 6–24)
Bilirubin Total: 0.3 mg/dL (ref 0.0–1.2)
CO2: 23 mmol/L (ref 20–29)
Calcium: 9.3 mg/dL (ref 8.7–10.2)
Chloride: 103 mmol/L (ref 96–106)
Creatinine, Ser: 0.88 mg/dL (ref 0.57–1.00)
Globulin, Total: 2.2 g/dL (ref 1.5–4.5)
Glucose: 81 mg/dL (ref 65–99)
Potassium: 4.5 mmol/L (ref 3.5–5.2)
Sodium: 141 mmol/L (ref 134–144)
Total Protein: 6.9 g/dL (ref 6.0–8.5)
eGFR: 83 mL/min/{1.73_m2} (ref >59)

## 2021-01-13 LAB — PHENYTOIN LEVEL, TOTAL: Phenytoin (Dilantin), Serum: 8.6 ug/mL — ABNORMAL LOW (ref 10.0–20.0)

## 2021-01-18 ENCOUNTER — Other Ambulatory Visit: Payer: Self-pay | Admitting: Neurology

## 2021-01-18 DIAGNOSIS — G43009 Migraine without aura, not intractable, without status migrainosus: Secondary | ICD-10-CM

## 2021-07-12 ENCOUNTER — Other Ambulatory Visit: Payer: Self-pay | Admitting: *Deleted

## 2021-07-12 ENCOUNTER — Encounter: Payer: Self-pay | Admitting: *Deleted

## 2021-07-12 DIAGNOSIS — G43009 Migraine without aura, not intractable, without status migrainosus: Secondary | ICD-10-CM

## 2021-07-12 MED ORDER — PROMETHAZINE HCL 25 MG PO TABS: 25.0000 mg | ORAL_TABLET | Freq: Three times a day (TID) | ORAL | 3 refills | Status: DC | PRN

## 2021-07-12 MED ORDER — NARATRIPTAN HCL 2.5 MG PO TABS: ORAL_TABLET | ORAL | 4 refills | Status: DC

## 2021-10-19 ENCOUNTER — Other Ambulatory Visit: Payer: Self-pay

## 2021-10-19 DIAGNOSIS — G43009 Migraine without aura, not intractable, without status migrainosus: Secondary | ICD-10-CM

## 2021-10-19 NOTE — Progress Notes (Unsigned)
Error

## 2021-11-22 ENCOUNTER — Other Ambulatory Visit: Payer: Self-pay

## 2021-11-22 MED ORDER — NARATRIPTAN HCL 2.5 MG PO TABS: ORAL_TABLET | ORAL | 4 refills | Status: DC

## 2021-11-22 NOTE — Progress Notes (Signed)
Rx refilled.

## 2021-11-23 ENCOUNTER — Telehealth: Payer: Self-pay | Admitting: Neurology

## 2021-11-23 NOTE — Telephone Encounter (Signed)
Following up from pharmacy, patient requested refill of Phenergan 25 mg tablet, was refilled 07/12/2021 30 tablets with 3 refills.  Last filled 10/18/2021.  This is supposed to be used as needed for nausea with headache.  It appears she is taking this medication very frequently, more than ideal.  I have sent her a MyChart message asking for further information.

## 2021-11-23 NOTE — Telephone Encounter (Signed)
Refill request from pharmacy will be placed on hold until patient clarification is obtained.

## 2021-12-11 ENCOUNTER — Other Ambulatory Visit: Payer: Self-pay

## 2021-12-11 MED ORDER — PROPRANOLOL HCL ER 120 MG PO CP24: 120.0000 mg | ORAL_CAPSULE | Freq: Every day | ORAL | 0 refills | Status: DC

## 2021-12-11 NOTE — Progress Notes (Signed)
Rx refilled.

## 2022-01-15 ENCOUNTER — Other Ambulatory Visit: Payer: Self-pay | Admitting: Nurse Practitioner

## 2022-01-15 DIAGNOSIS — Z1231 Encounter for screening mammogram for malignant neoplasm of breast: Secondary | ICD-10-CM

## 2022-01-16 ENCOUNTER — Ambulatory Visit: Payer: Commercial Managed Care - PPO | Admitting: Neurology

## 2022-01-31 ENCOUNTER — Ambulatory Visit: Payer: 59

## 2022-02-02 ENCOUNTER — Ambulatory Visit
Admission: RE | Admit: 2022-02-02 | Discharge: 2022-02-02 | Disposition: A | Discharge status: Home / Self Care | Payer: Commercial Managed Care - PPO | Source: Ambulatory Visit | Attending: Nurse Practitioner | Admitting: Nurse Practitioner

## 2022-02-02 DIAGNOSIS — Z1231 Encounter for screening mammogram for malignant neoplasm of breast: Secondary | ICD-10-CM

## 2022-04-04 NOTE — Progress Notes (Unsigned)
Patient: Isabella Mcintyre Date of Birth: 11/16/1975  Reason for Visit: Follow up History from: Patient Primary Neurologist: [place]  ASSESSMENT AND PLAN 46 y.o. year old female   1.  Migraine headache -Start Emgality for migraine prevention, given # 2 sample boxes  -Given samples of Nurtec to try, if she sees benefit I will send in a prescription -Continue Amerge for acute headache, can combine with Phenergan for significant nausea with headache -Continue Inderal LA 120 mg daily for migraine prevention -If headaches continue, will proceed with Botox  2.  Seizures -Last seizure was in her 58s -Continue Dilantin, check labs -Does not wish to taper Dilantin due to driving restriction  -Seeing PCP next week and will check vitamin D  HISTORY OF PRESENT ILLNESS: Today 04/05/22 Here today for follow-up. 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 The Amerge helps if she can take it early, only gets 9 tablets a month, takes phenergan if needed. Still on propranolol LA 120 mg daily, it has helped. Last seizure was in her 20's, only 2 in her life, remains on Dilantin, seizures were generalized. Has not tapered off, due to not being able to drive.   Tried and Failed: Topamax, propranolol, fioricet, Imitrex, amitriptyline, Frova.  HISTORY  01/12/21 Dr. Anne Hahn: Isabella Mcintyre is a 46 year old right-handed white female with a history of seizures and migraine headache.  The patient is doing quite well on Dilantin with her seizures, her last seizure event was in 1998.  The patient works as a Librarian, academic currently.  She has migraine headaches that generally occur once or twice a week, she may have up to 8 headache days a month.  Prior to going on propranolol, she was having 10 to 15 days with headache a month.  The patient may have some occasional nausea or vomiting with the headache.  She does not miss work.  In the past she has been on Topamax.  The  patient tolerates propranolol fairly well, she denies any significant fatigue or depression issues.  The patient is on vitamin D supplementation with her Dilantin.  She returns for further evaluation.   REVIEW OF SYSTEMS: Out of a complete 14 system review of symptoms, the patient complains only of the following symptoms, and all other reviewed systems are negative.  See HPI  ALLERGIES: No Known Allergies  HOME MEDICATIONS: Outpatient Medications Prior to Visit  Medication Sig Dispense Refill   Ascorbic Acid (VITAMIN C) 1000 MG tablet Take 1,000 mg by mouth daily.     cholecalciferol (VITAMIN D) 1000 UNITS tablet Take 1,000 Units by mouth daily.     naratriptan (AMERGE) 2.5 MG tablet TAKE 1 TAB AT ONSET OF HEADACHE IF RETURNS OR DOESNT RESOLVE, MAY REPEAT AFTER 4 HRS MAX 5MG /24 HRS 9 tablet 4   omeprazole (PRILOSEC) 40 MG capsule Take one capsule by mouth daily before dinner     phenytoin (DILANTIN) 100 MG ER capsule Take 2 capsules (200 mg total) by mouth 2 (two) times daily. 360 capsule 3   promethazine (PHENERGAN) 25 MG tablet Take 1 tablet (25 mg total) by mouth every 8 (eight) hours as needed. 30 tablet 3   propranolol ER (INDERAL LA) 120 MG 24 hr capsule Take 1 capsule (120 mg total) by mouth daily. 90 capsule 0   No facility-administered medications prior to visit.    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  SOCIAL HISTORY: Social History   Socioeconomic History   Marital status: Married    Spouse name: Not on file   Number of children: Not on file   Years of education: Not on file   Highest education level: Not on file   Occupational History   Not on file  Tobacco Use   Smoking status: Never   Smokeless tobacco: Never  Substance and Sexual Activity   Alcohol use: No   Drug use: No   Sexual activity: Not on file  Other Topics Concern   Not on file  Social History Narrative   Not on file   Social Determinants of Health   Financial Resource Strain: Not on file  Food Insecurity: Not on file  Transportation Needs: Not on file  Physical Activity: Not on file  Stress: Not on file  Social Connections: Not on file  Intimate Partner Violence: Not on file    PHYSICAL EXAM  Vitals:   04/05/22 1417  Weight: 180 lb (81.6 kg)  Height: 5\' 4"  (1.626 m)   Body mass index is 30.9 kg/m.  Generalized: Well developed, in no acute distress  Neurological examination  Mentation: Alert oriented to time, place, history taking. Follows all commands speech and language fluent Cranial nerve II-XII: Pupils were equal round reactive to light. Extraocular movements were full, visual field were full on confrontational test. Facial sensation and strength were normal. Head turning and shoulder shrug  were normal and symmetric. Motor: The motor testing reveals 5 over 5 strength of all 4 extremities. Good symmetric motor tone is noted throughout.  Sensory: Sensory testing is intact to soft touch on all 4 extremities. No evidence of extinction is noted.  Coordination: Cerebellar testing reveals good finger-nose-finger and heel-to-shin bilaterally.  Gait and station: Gait is normal.  Reflexes: Deep tendon reflexes are symmetric and normal bilaterally.   DIAGNOSTIC DATA (LABS, IMAGING, TESTING) - I reviewed patient records, labs, notes, testing and imaging myself where available.  Lab Results  Component Value Date   WBC 8.6 01/12/2021   HGB 15.5 01/12/2021   HCT 45.9 01/12/2021   MCV 89 01/12/2021   PLT 222 01/12/2021      Component Value Date/Time   NA 141 01/12/2021 0923   K 4.5 01/12/2021 0923   CL 103  01/12/2021 0923   CO2 23 01/12/2021 0923   GLUCOSE 81 01/12/2021 0923   GLUCOSE 123 (H) 10/18/2011 1410   BUN 16 01/12/2021 0923   CREATININE 0.88 01/12/2021 0923   CALCIUM 9.3 01/12/2021 0923   PROT 6.9 01/12/2021 0923   ALBUMIN 4.7 01/12/2021 0923   AST 18 01/12/2021 0923   ALT 15 01/12/2021 0923   ALKPHOS 53 01/12/2021 0923   BILITOT 0.3 01/12/2021 0923   GFRNONAA 86 12/14/2019 0845   GFRAA 99 12/14/2019 0845   No results found for: "CHOL", "HDL", "LDLCALC", "LDLDIRECT", "TRIG", "CHOLHDL" No results found for: "HGBA1C" No results found for: "VITAMINB12" No results found for: "TSH"  #4 Boxes of Nurtec 12/16/2019 07/2024 #2 Boxes Emgality 08/2024 K 06/18/2023  06/20/2023, AGNP-C, DNP 04/05/2022, 2:21 PM [facility] [address] [place], [place] [address] [phone]

## 2022-04-05 ENCOUNTER — Ambulatory Visit: Payer: Commercial Managed Care - PPO | Admitting: Neurology

## 2022-04-05 VITALS — BP 119/79 | HR 78 | Ht 64.0 in | Wt 180.0 lb

## 2022-04-05 DIAGNOSIS — G40309 Generalized idiopathic epilepsy and epileptic syndromes, not intractable, without status epilepticus: Secondary | ICD-10-CM

## 2022-04-05 DIAGNOSIS — G43009 Migraine without aura, not intractable, without status migrainosus: Secondary | ICD-10-CM | POA: Diagnosis not present

## 2022-04-05 MED ORDER — PROPRANOLOL HCL ER 120 MG PO CP24: 120.0000 mg | ORAL_CAPSULE | Freq: Every day | ORAL | 1 refills | Status: DC

## 2022-04-05 MED ORDER — EMGALITY 120 MG/ML ~~LOC~~ SOAJ: 120.0000 mg | SUBCUTANEOUS | 11 refills | Status: DC

## 2022-04-05 MED ORDER — NARATRIPTAN HCL 2.5 MG PO TABS: ORAL_TABLET | ORAL | 11 refills | Status: AC

## 2022-04-05 MED ORDER — PHENYTOIN SODIUM EXTENDED 100 MG PO CAPS: 200.0000 mg | ORAL_CAPSULE | Freq: Two times a day (BID) | ORAL | 3 refills | Status: DC

## 2022-04-05 MED ORDER — PROMETHAZINE HCL 25 MG PO TABS: 25.0000 mg | ORAL_TABLET | Freq: Three times a day (TID) | ORAL | 3 refills | Status: AC | PRN

## 2022-04-05 NOTE — Patient Instructions (Signed)
Start Emgality for migraine prevention, the 1st month you will be do 2 shots for the loading dose, followed by 1 shot monthly thereafter  Try Nurtec for acute headache treatment, if you like it I will send in samples  See you back in 6 months

## 2022-04-06 LAB — CBC WITH DIFFERENTIAL/PLATELET
Basophils Absolute: 0.1 x10E3/uL (ref 0.0–0.2)
Basos: 1 %
EOS (ABSOLUTE): 0.2 x10E3/uL (ref 0.0–0.4)
Eos: 2 %
Hematocrit: 43.7 % (ref 34.0–46.6)
Hemoglobin: 14.9 g/dL (ref 11.1–15.9)
Immature Grans (Abs): 0.1 x10E3/uL (ref 0.0–0.1)
Immature Granulocytes: 1 %
Lymphocytes Absolute: 2.4 x10E3/uL (ref 0.7–3.1)
Lymphs: 22 %
MCH: 29.2 pg (ref 26.6–33.0)
MCHC: 34.1 g/dL (ref 31.5–35.7)
MCV: 86 fL (ref 79–97)
Monocytes Absolute: 0.7 x10E3/uL (ref 0.1–0.9)
Monocytes: 7 %
Neutrophils Absolute: 7.3 x10E3/uL — ABNORMAL HIGH (ref 1.4–7.0)
Neutrophils: 67 %
Platelets: 238 x10E3/uL (ref 150–450)
RBC: 5.11 x10E6/uL (ref 3.77–5.28)
RDW: 12.3 % (ref 11.7–15.4)
WBC: 10.7 x10E3/uL (ref 3.4–10.8)

## 2022-04-06 LAB — COMPREHENSIVE METABOLIC PANEL
ALT: 23 IU/L (ref 0–32)
AST: 17 IU/L (ref 0–40)
Albumin/Globulin Ratio: 1.8 (ref 1.2–2.2)
Albumin: 4.4 g/dL (ref 3.9–4.9)
Alkaline Phosphatase: 60 IU/L (ref 44–121)
BUN/Creatinine Ratio: 12 (ref 9–23)
BUN: 11 mg/dL (ref 6–24)
Bilirubin Total: 0.3 mg/dL (ref 0.0–1.2)
CO2: 25 mmol/L (ref 20–29)
Calcium: 9.2 mg/dL (ref 8.7–10.2)
Chloride: 99 mmol/L (ref 96–106)
Creatinine, Ser: 0.91 mg/dL (ref 0.57–1.00)
Globulin, Total: 2.5 g/dL (ref 1.5–4.5)
Glucose: 100 mg/dL — ABNORMAL HIGH (ref 70–99)
Potassium: 4 mmol/L (ref 3.5–5.2)
Sodium: 138 mmol/L (ref 134–144)
Total Protein: 6.9 g/dL (ref 6.0–8.5)
eGFR: 78 mL/min/{1.73_m2} (ref >59)

## 2022-04-06 LAB — PHENYTOIN LEVEL, TOTAL: Phenytoin (Dilantin), Serum: 8.8 ug/mL — ABNORMAL LOW (ref 10.0–20.0)

## 2022-04-09 ENCOUNTER — Telehealth: Payer: Self-pay | Admitting: Neurology

## 2022-04-09 NOTE — Telephone Encounter (Signed)
[facility]) request refills for  propranolol ER (INDERAL LA) 120 MG 24 hr capsule and phenytoin (DILANTIN) 100 MG ER capsule

## 2022-04-09 NOTE — Telephone Encounter (Signed)
I left a VM (as per DPR) for the patient. The patient's refills were sent on 04/05/2022 to [facility]. I have requested a return call to verify the preferred pharmacy.

## 2022-04-16 ENCOUNTER — Telehealth: Payer: Self-pay

## 2022-04-16 ENCOUNTER — Other Ambulatory Visit: Payer: Self-pay | Admitting: Neurology

## 2022-04-16 NOTE — Telephone Encounter (Signed)
[facility] ([facility] [place]) Rx #: [id number] Emgality 120MG /ML auto-injectors (migraine)  Case #-[id number] Pending

## 2022-04-18 NOTE — Telephone Encounter (Signed)
Form was requested to be completed. Completed and placed on NP's desk for signature.

## 2022-04-25 NOTE — Telephone Encounter (Signed)
I received a message from [employer]. "[employer] Rx Prior Authorization Team is unable to review this request for prior authorization as the medication has been previously approved via case [id number]."

## 2022-05-23 ENCOUNTER — Telehealth: Payer: Self-pay

## 2022-05-23 ENCOUNTER — Encounter: Payer: Self-pay | Admitting: Neurology

## 2022-05-23 NOTE — Telephone Encounter (Signed)
A PA for Emgality 120MG /ML auto-injectors has been submitted on CMM. (Key: [id number])

## 2022-05-23 NOTE — Telephone Encounter (Signed)
The denial was based on our criteria for Emgality Inj 120mg /Ml.  The requested drug is not covered by the plan. Please contact member services for further assistance. Reviewed by: [id number], R.N.

## 2022-05-24 NOTE — Telephone Encounter (Signed)
An appeal letter was created and submitted to the plan via CMM.

## 2022-05-31 NOTE — Telephone Encounter (Signed)
The appeal request has been denied.

## 2022-06-05 MED ORDER — AJOVY 225 MG/1.5ML ~~LOC~~ SOAJ: 225.0000 mg | SUBCUTANEOUS | 11 refills | Status: DC

## 2022-06-05 NOTE — Addendum Note (Signed)
Addended by: Suzzanne Cloud on: 06/05/2022 08:38 AM   Modules accepted: Orders

## 2022-06-05 NOTE — Telephone Encounter (Signed)
I reviewed the denial case. Insurance denied PA and appeal due to the fact pt has not tried and failed both aimovig and ajovy.   Both of these would need to be tried/failed first due to the 2024 formulary change.

## 2022-06-05 NOTE — Telephone Encounter (Signed)
I will order Ajovy. Please let the patient know. We can help with samples if insurance approval is going to be delayed. She really has quite a lot of headaches. Thanks. I sent to local pharmacy . Meds ordered this encounter  Medications   Fremanezumab-vfrm (AJOVY) 225 MG/1.5ML SOAJ    Sig: Inject 225 mg into the skin every 30 (thirty) days.    Dispense:  1.68 mL    Refill:  11

## 2022-06-07 MED ORDER — AJOVY 225 MG/1.5ML ~~LOC~~ SOAJ: SUBCUTANEOUS | 0 refills | Status: DC

## 2022-06-07 NOTE — Addendum Note (Signed)
Addended by: Verlin Grills on: 06/07/2022 07:54 AM   Modules accepted: Orders

## 2022-06-22 MED ORDER — PHENYTOIN SODIUM EXTENDED 100 MG PO CAPS: 200.0000 mg | ORAL_CAPSULE | Freq: Two times a day (BID) | ORAL | 3 refills | Status: DC

## 2022-06-22 MED ORDER — AJOVY 225 MG/1.5ML ~~LOC~~ SOAJ: 225.0000 mg | SUBCUTANEOUS | 11 refills | Status: DC

## 2022-06-22 MED ORDER — PROPRANOLOL HCL ER 120 MG PO CP24: 120.0000 mg | ORAL_CAPSULE | Freq: Every day | ORAL | 1 refills | Status: DC

## 2022-06-22 NOTE — Addendum Note (Signed)
Addended by: Verlin Grills on: 06/22/2022 10:11 AM   Modules accepted: Orders

## 2022-07-05 MED ORDER — AJOVY 225 MG/1.5ML ~~LOC~~ SOAJ: 225.0000 mg | SUBCUTANEOUS | 11 refills | Status: DC

## 2022-07-05 NOTE — Addendum Note (Signed)
Addended by: Verlin Grills on: 07/05/2022 07:44 AM   Modules accepted: Orders

## 2022-07-09 ENCOUNTER — Other Ambulatory Visit: Payer: Self-pay

## 2022-07-09 ENCOUNTER — Telehealth: Payer: Self-pay

## 2022-07-09 MED ORDER — AJOVY 225 MG/1.5ML ~~LOC~~ SOAJ: SUBCUTANEOUS | 0 refills | Status: DC

## 2022-07-09 NOTE — Telephone Encounter (Signed)
PA for Ajovy needed, thank you.

## 2022-07-11 ENCOUNTER — Other Ambulatory Visit (HOSPITAL_COMMUNITY): Payer: Self-pay

## 2022-07-11 NOTE — Telephone Encounter (Signed)
Patient Advocate Encounter   Received notification that prior authorization for AJOVY (fremanezumab-vfrm) injection 225MG/1.5ML auto-injectors is required.   PA submitted on 07/11/2022 [id number] Status is pending       Lyndel Safe, [place] Patient Advocate Specialist [place] Patient Advocate Team Direct Number: [phone]  Fax: [phone]

## 2022-07-13 NOTE — Telephone Encounter (Signed)
Patient Advocate Encounter  Prior Authorization for AJOVY (fremanezumab-vfrm) injection '225MG'$ /1.5ML auto-injectors has been approved.    PA# [id number] Effective dates: 07/11/2022 through 01/09/2023      Lyndel Safe, [place] Patient Advocate Specialist [place] Patient Advocate Team Direct Number: [phone]  Fax: [phone]

## 2022-09-19 ENCOUNTER — Telehealth: Payer: Self-pay | Admitting: Neurology

## 2022-09-19 NOTE — Telephone Encounter (Signed)
LVM and sent mychart msg informing pt of need to reschedule 11/01/22 appt - NP out 

## 2022-11-01 ENCOUNTER — Ambulatory Visit: Payer: Commercial Managed Care - PPO | Admitting: Neurology

## 2022-12-17 ENCOUNTER — Encounter: Payer: Self-pay | Admitting: Neurology

## 2022-12-17 ENCOUNTER — Other Ambulatory Visit: Payer: Self-pay | Admitting: Neurology

## 2022-12-17 ENCOUNTER — Ambulatory Visit: Payer: Commercial Managed Care - PPO | Admitting: Neurology

## 2022-12-17 VITALS — BP 134/88 | HR 79 | Ht 63.75 in | Wt 195.0 lb

## 2022-12-17 DIAGNOSIS — G43009 Migraine without aura, not intractable, without status migrainosus: Secondary | ICD-10-CM | POA: Diagnosis not present

## 2022-12-17 DIAGNOSIS — G40309 Generalized idiopathic epilepsy and epileptic syndromes, not intractable, without status epilepticus: Secondary | ICD-10-CM | POA: Diagnosis not present

## 2022-12-17 MED ORDER — AJOVY 225 MG/1.5ML ~~LOC~~ SOAJ: 225.0000 mg | SUBCUTANEOUS | 11 refills | Status: DC

## 2022-12-17 MED ORDER — PHENYTOIN SODIUM EXTENDED 100 MG PO CAPS: 200.0000 mg | ORAL_CAPSULE | Freq: Two times a day (BID) | ORAL | 3 refills | Status: DC

## 2022-12-17 MED ORDER — NURTEC 75 MG PO TBDP: 75.0000 mg | ORAL_TABLET | ORAL | 11 refills | Status: DC | PRN

## 2022-12-17 MED ORDER — PROPRANOLOL HCL ER 120 MG PO CP24: 120.0000 mg | ORAL_CAPSULE | Freq: Every day | ORAL | 3 refills | Status: DC

## 2022-12-17 NOTE — Progress Notes (Signed)
Patient: Isabella Mcintyre Date of Birth: 05-16-1976  Reason for Visit: Follow up History from: Patient Primary Neurologist: [place]  ASSESSMENT AND PLAN 47 y.o. year old female   1.  Migraine headache -Under much better control, significant improvement with Ajovy. Down fromn 15 to 5 migraines monthly, slower onset  -Continue Ajovy 225 mg monthly injection for migraine prevention -Nurtec 75 mg as needed for acute headache, also has Amerge if needed, may combine with Phenergan for significant nausea with migraine -Continue Inderal LA 120 mg daily for migraine prevention, talked about weaning but wishes to continue due to episodes of heart racing historically -Tried and Failed: Topamax, propranolol, fioricet, Imitrex, amitriptyline, Frova.  2.  Seizures -Last seizure was in her 55s, only 2 in her life, were generalized seizure events -We had discussed weaning off Dilantin or switching to a newer seizure medication with less long-term side effects in the future.  I will ask her to see Dr. Teresa Coombs in the future for his opinion on plans going forward. -For now, she will continue vitamin D and vitamin D supplement  HISTORY OF PRESENT ILLNESS: Today 12/17/22 Labs in November 2023 CBC and CMP unremarkable, Dilantin level 8.8. Migraines much better on Ajovy, now much less severe, only 5 a month, has more time to intervene. Nurtec worked well as samples. Amerge works well too, but harder time swallowing pills, Nurtec much easier, and faster. Has not needed phenergan. No seizures on Dilantin. On Vitamin D. Wants to stay on propranolol due to tachycardia in the past (reports HR went up into the 170's).   04/05/22 SS: Here today for follow-up. 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 The Amerge helps if she can take it early, only gets 9 tablets a month, takes phenergan if needed. Still on propranolol LA 120 mg daily, it has helped. Last seizure was in  her 20's, only 2 in her life, remains on Dilantin, seizures were generalized. Has not tapered off, due to not being able to drive.   HISTORY  01/12/21 Dr. Anne Hahn: Ms. Hauver is a 47 year old right-handed white female with a history of seizures and migraine headache.  The patient is doing quite well on Dilantin with her seizures, her last seizure event was in 1998.  The patient works as a Librarian, academic currently.  She has migraine headaches that generally occur once or twice a week, she may have up to 8 headache days a month.  Prior to going on propranolol, she was having 10 to 15 days with headache a month.  The patient may have some occasional nausea or vomiting with the headache.  She does not miss work.  In the past she has been on Topamax.  The patient tolerates propranolol fairly well, she denies any significant fatigue or depression issues.  The patient is on vitamin D supplementation with her Dilantin.  She returns for further evaluation.   REVIEW OF SYSTEMS: Out of a complete 14 system review of symptoms, the patient complains only of the following symptoms, and all other reviewed systems are negative.  See HPI  ALLERGIES: No Known Allergies  HOME MEDICATIONS: Outpatient Medications Prior to Visit  Medication Sig Dispense Refill   Ascorbic Acid (VITAMIN C) 1000 MG tablet Take 1,000 mg by mouth daily.     cholecalciferol (VITAMIN D) 1000 UNITS tablet Take 1,000 Units by mouth daily.     naratriptan (AMERGE) 2.5 MG tablet TAKE 1 TAB AT ONSET  OF HEADACHE IF RETURNS OR DOESNT RESOLVE, MAY REPEAT AFTER 4 HRS MAX 5MG /24 HRS 9 tablet 11   omeprazole (PRILOSEC) 40 MG capsule Take one capsule by mouth daily before dinner     promethazine (PHENERGAN) 25 MG tablet Take 1 tablet (25 mg total) by mouth every 8 (eight) hours as needed. 30 tablet 3   Fremanezumab-vfrm (AJOVY) 225 MG/1.5ML SOAJ Inject 225 mg into the skin every 30 (thirty) days. 1.68 mL 11   phenytoin (DILANTIN) 100 MG ER capsule  Take 2 capsules (200 mg total) by mouth 2 (two) times daily. 360 capsule 3   propranolol ER (INDERAL LA) 120 MG 24 hr capsule Take 1 capsule (120 mg total) by mouth daily. 90 capsule 1   Fremanezumab-vfrm (AJOVY) 225 MG/1.5ML SOAJ Inject 1 every 28 days 1.68 mL 0   No facility-administered medications prior to visit.    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 No pertinent family history.  SOCIAL HISTORY: Social History   Socioeconomic History   Marital status: Married    Spouse name: Not on file   Number of children: Not on file   Years of education: Not on file   Highest education level: Not on file  Occupational History   Not on file  Tobacco Use   Smoking status: Never   Smokeless tobacco: Never  Substance and Sexual Activity   Alcohol use: No   Drug use: No   Sexual activity: Not on file  Other Topics Concern   Not on file  Social History Narrative   Not on file   Social Determinants of Health   Financial Resource Strain: Not on file  Food Insecurity: Not on file  Transportation Needs: Not on file  Physical Activity: Not on file  Stress: Not on file  Social Connections: Not on file  Intimate Partner Violence: Not on file   PHYSICAL EXAM  Vitals:   12/17/22 1047  BP: 134/88  Pulse: 79  Weight: 195 lb (88.5 kg)  Height: 5' 3.75" (1.619 m)    Body mass index is 33.73 kg/m.  Generalized: Well developed, in no acute distress  Neurological examination  Mentation: Alert oriented to time, place, history taking. Follows all commands speech and language fluent Cranial nerve II-XII: Pupils were equal round reactive to  light. Extraocular movements were full, visual field were full on confrontational test. Facial sensation and strength were normal. Head turning and shoulder shrug  were normal and symmetric. Motor: The motor testing reveals 5 over 5 strength of all 4 extremities. Good symmetric motor tone is noted throughout.  Sensory: Sensory testing is intact to soft touch on all 4 extremities. No evidence of extinction is noted.  Coordination: Cerebellar testing reveals good finger-nose-finger and heel-to-shin bilaterally.  Gait and station: Gait is normal.  Reflexes: Deep tendon reflexes are symmetric and normal bilaterally.   DIAGNOSTIC DATA (LABS, IMAGING, TESTING) - I reviewed patient records, labs, notes, testing and imaging myself where available.  Lab Results  Component Value Date   WBC 10.7 04/05/2022   HGB 14.9 04/05/2022   HCT 43.7  04/05/2022   MCV 86 04/05/2022   PLT 238 04/05/2022      Component Value Date/Time   NA 138 04/05/2022 1449   K 4.0 04/05/2022 1449   CL 99 04/05/2022 1449   CO2 25 04/05/2022 1449   GLUCOSE 100 (H) 04/05/2022 1449   GLUCOSE 123 (H) 10/18/2011 1410   BUN 11 04/05/2022 1449   CREATININE 0.91 04/05/2022 1449   CALCIUM 9.2 04/05/2022 1449   PROT 6.9 04/05/2022 1449   ALBUMIN 4.4 04/05/2022 1449   AST 17 04/05/2022 1449   ALT 23 04/05/2022 1449   ALKPHOS 60 04/05/2022 1449   BILITOT 0.3 04/05/2022 1449   GFRNONAA 86 12/14/2019 0845   GFRAA 99 12/14/2019 0845   No results found for: "CHOL", "HDL", "LDLCALC", "LDLDIRECT", "TRIG", "CHOLHDL" No results found for: "HGBA1C" No results found for: "VITAMINB12" No results found for: "TSH"  #4 Boxes of Nurtec [id number] 07/2024 #2 Boxes Emgality [id number] 06/18/2023  Margie Ege, AGNP-C, DNP 12/17/2022, 11:13 AM [facility] [address] [place], [place] [address] [phone]

## 2022-12-17 NOTE — Patient Instructions (Signed)
So great to see you today.  Glad you are doing better!!  We will continue current medications.  I will have you see Dr. Teresa Coombs in 1 year, can discuss future plans going forward.  Let me know if you need anything.  Thanks!!

## 2022-12-26 ENCOUNTER — Telehealth: Payer: Self-pay

## 2022-12-26 ENCOUNTER — Other Ambulatory Visit (HOSPITAL_COMMUNITY): Payer: Self-pay

## 2022-12-26 NOTE — Telephone Encounter (Signed)
Pharmacy Patient Advocate Encounter  Received notification from [facility] that Prior Authorization for Nurtec 75MG  dispersible tablets has been APPROVED from 12/26/2022 to 03/28/2023. Ran test claim, Copay is $0. This test claim was processed through [facility]- copay amounts may vary at other pharmacies due to pharmacy/plan contracts, or as the patient moves through the different stages of their insurance plan.   PA #/Case ID/Reference #: PA Case ID #: [id number]

## 2022-12-26 NOTE — Telephone Encounter (Signed)
Pharmacy Patient Advocate Encounter   Received notification from Physician's Office that prior authorization for Nurtec 75MG  dispersible tablets is required/requested.   Insurance verification completed.   The patient is insured through [facility] .   Per test claim: PA required; PA submitted to [facility] via CoverMyMeds Key/confirmation #/EOC [id number] Status is pending

## 2023-01-14 MED ORDER — LEVETIRACETAM 750 MG PO TABS: 750.0000 mg | ORAL_TABLET | Freq: Two times a day (BID) | ORAL | 11 refills | Status: DC

## 2023-01-14 MED ORDER — PHENYTOIN SODIUM EXTENDED 100 MG PO CAPS: 200.0000 mg | ORAL_CAPSULE | Freq: Two times a day (BID) | ORAL | 3 refills | Status: DC

## 2023-01-14 NOTE — Telephone Encounter (Signed)
That is a good plan. Decrease Dilantin to 200 mg night and start with Keppra 750 mg BID.

## 2023-01-14 NOTE — Addendum Note (Signed)
Addended by: Danne Harbor on: 01/14/2023 07:47 AM   Modules accepted: Orders

## 2023-01-14 NOTE — Telephone Encounter (Signed)
I would stay 200 mg nightly for one month then discontinue it

## 2023-01-14 NOTE — Addendum Note (Signed)
Addended by: Glean Salvo on: 01/14/2023 03:50 PM   Modules accepted: Orders

## 2023-01-15 ENCOUNTER — Other Ambulatory Visit (HOSPITAL_COMMUNITY): Payer: Self-pay

## 2023-01-15 ENCOUNTER — Telehealth: Payer: Self-pay

## 2023-01-15 NOTE — Telephone Encounter (Signed)
PA request has been Submitted. New Encounter created for follow up. For additional info see Pharmacy Prior Auth telephone encounter from 01/15/2023.

## 2023-01-15 NOTE — Telephone Encounter (Signed)
 Pa needed for ajovy

## 2023-01-15 NOTE — Telephone Encounter (Signed)
Pharmacy Patient Advocate Encounter   Received notification from Physician's Office that prior authorization for AJOVY (fremanezumab-vfrm) injection 225MG /1.5ML auto-injectors is required/requested.   Insurance verification completed.   The patient is insured through [facility] .   Per test claim: PA required; PA submitted to [facility] via CoverMyMeds Key/confirmation #/EOC [id number] Status is pending

## 2023-01-15 NOTE — Telephone Encounter (Signed)
Pharmacy Patient Advocate Encounter  Received notification from [facility] that Prior Authorization for AJOVY (fremanezumab-vfrm) injection 225MG /1.5ML auto-injectors has been APPROVED from 01/15/2023 to 01/15/2024. Ran test claim, Copay is $24.98. This test claim was processed through [facility]- copay amounts may vary at other pharmacies due to pharmacy/plan contracts, or as the patient moves through the different stages of their insurance plan.   PA #/Case ID/Reference #: PA Case ID #: [id number]

## 2023-02-28 ENCOUNTER — Other Ambulatory Visit: Payer: Self-pay | Admitting: Neurology

## 2023-02-28 ENCOUNTER — Telehealth: Payer: Self-pay | Admitting: Neurology

## 2023-02-28 DIAGNOSIS — G40309 Generalized idiopathic epilepsy and epileptic syndromes, not intractable, without status epilepticus: Secondary | ICD-10-CM

## 2023-02-28 NOTE — Telephone Encounter (Signed)
We can check a Keppra level. I will put in the order and patient can walk in next week to have it completed.

## 2023-02-28 NOTE — Telephone Encounter (Signed)
Pt called wanting to inform the provider that she has been off of the Dilantin for a month and is ready for the lab work that is needing to be ordered. Please advise.

## 2023-03-05 ENCOUNTER — Telehealth: Payer: Self-pay

## 2023-03-06 NOTE — Telephone Encounter (Signed)
Took incoming call from patient and she reported she will come by this week for Keppra level

## 2023-03-08 ENCOUNTER — Telehealth: Payer: Self-pay

## 2023-03-08 ENCOUNTER — Other Ambulatory Visit (HOSPITAL_COMMUNITY): Payer: Self-pay

## 2023-03-08 NOTE — Telephone Encounter (Signed)
Pharmacy Patient Advocate Encounter  Received notification from [facility] that Prior Authorization for Nurtec 75MG  dispersible tablets  has been APPROVED from 03/08/2023 to 03/07/2024. Ran test claim, Copay is $0.00. This test claim was processed through [facility]- copay amounts may vary at other pharmacies due to pharmacy/plan contracts, or as the patient moves through the different stages of their insurance plan.

## 2023-03-08 NOTE — Telephone Encounter (Signed)
*  [facility]  Pharmacy Patient Advocate Encounter   Received notification from CoverMyMeds that prior authorization for Nurtec 75MG  dispersible tablets  is required/requested.   Insurance verification completed.   The patient is insured through [facility] .   Per test claim: PA required; PA submitted to [facility] via CoverMyMeds Key/confirmation #/EOC [id number] Status is pending

## 2023-03-13 ENCOUNTER — Other Ambulatory Visit (INDEPENDENT_AMBULATORY_CARE_PROVIDER_SITE_OTHER): Payer: Self-pay

## 2023-03-13 ENCOUNTER — Other Ambulatory Visit: Payer: Self-pay | Admitting: Neurology

## 2023-03-13 ENCOUNTER — Other Ambulatory Visit: Payer: Self-pay

## 2023-03-13 DIAGNOSIS — G40309 Generalized idiopathic epilepsy and epileptic syndromes, not intractable, without status epilepticus: Secondary | ICD-10-CM

## 2023-03-13 DIAGNOSIS — Z0289 Encounter for other administrative examinations: Secondary | ICD-10-CM

## 2023-03-13 NOTE — Progress Notes (Signed)
New order placed for Keppra level and order for future.

## 2023-03-14 LAB — LEVETIRACETAM LEVEL: Levetiracetam Lvl: 23 ug/mL (ref 10.0–40.0)

## 2023-04-04 ENCOUNTER — Encounter: Payer: Self-pay | Admitting: Neurology

## 2023-04-08 MED ORDER — LEVETIRACETAM 750 MG PO TABS: 750.0000 mg | ORAL_TABLET | Freq: Two times a day (BID) | ORAL | 11 refills | Status: DC

## 2023-04-17 ENCOUNTER — Other Ambulatory Visit: Payer: Self-pay | Admitting: Nurse Practitioner

## 2023-04-17 DIAGNOSIS — Z1231 Encounter for screening mammogram for malignant neoplasm of breast: Secondary | ICD-10-CM

## 2023-05-02 ENCOUNTER — Ambulatory Visit
Admission: RE | Admit: 2023-05-02 | Discharge: 2023-05-02 | Disposition: A | Discharge status: Home / Self Care | Payer: Commercial Managed Care - PPO | Source: Ambulatory Visit | Attending: Nurse Practitioner | Admitting: Nurse Practitioner

## 2023-05-02 DIAGNOSIS — Z1231 Encounter for screening mammogram for malignant neoplasm of breast: Secondary | ICD-10-CM

## 2023-05-17 ENCOUNTER — Ambulatory Visit: Payer: Commercial Managed Care - PPO

## 2023-07-16 ENCOUNTER — Encounter: Payer: Self-pay | Admitting: Neurology

## 2023-07-16 ENCOUNTER — Other Ambulatory Visit: Payer: Self-pay | Admitting: Neurology

## 2023-07-16 MED ORDER — AJOVY 225 MG/1.5ML ~~LOC~~ SOAJ: 225.0000 mg | SUBCUTANEOUS | 5 refills | Status: DC

## 2023-08-09 ENCOUNTER — Other Ambulatory Visit: Payer: Self-pay | Admitting: Neurology

## 2023-11-17 ENCOUNTER — Other Ambulatory Visit: Payer: Self-pay | Admitting: Neurology

## 2023-12-17 ENCOUNTER — Ambulatory Visit: Payer: Commercial Managed Care - PPO | Admitting: Neurology

## 2023-12-17 ENCOUNTER — Encounter: Payer: Self-pay | Admitting: Neurology

## 2023-12-17 VITALS — BP 120/81 | HR 73 | Resp 17 | Ht 63.75 in | Wt 197.0 lb

## 2023-12-17 DIAGNOSIS — G40309 Generalized idiopathic epilepsy and epileptic syndromes, not intractable, without status epilepticus: Secondary | ICD-10-CM

## 2023-12-17 DIAGNOSIS — Z5181 Encounter for therapeutic drug level monitoring: Secondary | ICD-10-CM

## 2023-12-17 MED ORDER — LAMOTRIGINE 25 MG PO TABS: ORAL_TABLET | ORAL | 0 refills | Status: DC

## 2023-12-17 MED ORDER — NURTEC 75 MG PO TBDP: 75.0000 mg | ORAL_TABLET | ORAL | 11 refills | Status: AC | PRN

## 2023-12-17 MED ORDER — LAMOTRIGINE 100 MG PO TABS: 100.0000 mg | ORAL_TABLET | Freq: Two times a day (BID) | ORAL | 3 refills | Status: DC

## 2023-12-17 NOTE — Progress Notes (Signed)
 Patient: Isabella Mcintyre Date of Birth: 1976/04/07  Reason for Visit: Follow up History from: Patient Primary Neurologist: Aryiana Klinkner  ASSESSMENT AND PLAN 48 y.o. year old female   1.  Migraine headache -Under much better control, significant improvement with Ajovy .   -Continue Ajovy  225 mg monthly injection for migraine prevention -Nurtec 75 mg as needed for acute headache, also has Amerge if needed, may combine with Phenergan  for significant nausea with migraine -Continue Inderal  LA 120 mg daily for migraine prevention, talked about weaning but wishes to continue due to episodes of heart racing historically -Tried and Failed: Topamax , propranolol , fioricet, Imitrex , amitriptyline, Frova.  2.  Seizures -Last seizure was in her 52s, only 2 in her life, were generalized seizure events -We switched to Levetiracetam , she is off Dilantin , no seizures but has side effect of increase anxiety and irritability.  -Will discontinue Levetiracetam  and start patient on Lamotrigine  25 mg daily with goal of 100 mg twice daily  -For now, she will continue vitamin D and vitamin D supplement  HISTORY OF PRESENT ILLNESS: Today 12/17/23 Patient presents today for follow-up, last visit was a year ago at that time she was still on the Dilantin .  In term of the seizures, a few months ago she called and we discontinued her Dilantin  and started her on levetiracetam  750 mg twice daily.  No reported seizures but there is increased irritability and anxiety, patient also reports some increase stress due to her  high stressful job.  Her PCP put her on Lexapro. Tells me that her migraines are well controlled.    INTERVAL HISTORY 12/17/2022 Labs in November 2023 CBC and CMP unremarkable, Dilantin  level 8.8. Migraines much better on Ajovy , now much less severe, only 5 a month, has more time to intervene. Nurtec worked well as samples. Amerge works well too, but harder time swallowing pills, Nurtec much easier, and  faster. Has not needed phenergan . No seizures on Dilantin . On Vitamin D. Wants to stay on propranolol  due to tachycardia in the past (reports HR went up into the 170's).   04/05/22 SS: Here today for follow-up. She is a paralegal, under a lot more stress. 15 headache days a month, most are migraines. The Amerge helps if she can take it early, only gets 9 tablets a month, takes phenergan  if needed. Still on propranolol  LA 120 mg daily, it has helped. Last seizure was in her 20's, only 2 in her life, remains on Dilantin , seizures were generalized. Has not tapered off, due to not being able to drive.   HISTORY  01/12/21 Dr. Jenel: [name] is a 48 year old right-handed white female with a history of seizures and migraine headache.  The patient is doing quite well on Dilantin  with her seizures, her last seizure event was in 1998.  The patient works as a Librarian, academic currently.  She has migraine headaches that generally occur once or twice a week, she may have up to 8 headache days a month.  Prior to going on propranolol , she was having 10 to 15 days with headache a month.  The patient may have some occasional nausea or vomiting with the headache.  She does not miss work.  In the past she has been on Topamax .  The patient tolerates propranolol  fairly well, she denies any significant fatigue or depression issues.  The patient is on vitamin D supplementation with her Dilantin .  She returns for further evaluation.   REVIEW OF SYSTEMS: Out of a complete 14 system review of  symptoms, the patient complains only of the following symptoms, and all other reviewed systems are negative.  See HPI  ALLERGIES: No Known Allergies  HOME MEDICATIONS: Outpatient Medications Prior to Visit  Medication Sig Dispense Refill   Ascorbic Acid (VITAMIN C) 1000 MG tablet Take 1,000 mg by mouth daily.     cholecalciferol (VITAMIN D) 1000 UNITS tablet Take 1,000 Units by mouth daily.     Fremanezumab -vfrm (AJOVY ) 225  MG/1.5ML SOSY INJECT 225 MG INTO THE SKIN EVERY 30 (THIRTY) DAYS. 1.5 mL 11   levETIRAcetam  (KEPPRA ) 750 MG tablet Take 1 tablet (750 mg total) by mouth 2 (two) times daily. 60 tablet 11   naratriptan  (AMERGE) 2.5 MG tablet TAKE 1 TAB AT ONSET OF HEADACHE IF RETURNS OR DOESNT RESOLVE, MAY REPEAT AFTER 4 HRS MAX 5MG /24 HRS 9 tablet 11   omeprazole (PRILOSEC) 40 MG capsule Take one capsule by mouth daily before dinner     phentermine 15 MG capsule Take 15 mg by mouth daily.     promethazine  (PHENERGAN ) 25 MG tablet Take 1 tablet (25 mg total) by mouth every 8 (eight) hours as needed. 30 tablet 3   propranolol  ER (INDERAL  LA) 120 MG 24 hr capsule TAKE 1 CAPSULE BY MOUTH DAILY 90 capsule 3   Rimegepant Sulfate (NURTEC) 75 MG TBDP Take 1 tablet (75 mg total) by mouth as needed (take 1 tablet at onset, max is 1 tablet daily). 8 tablet 11   Fremanezumab -vfrm (AJOVY ) 225 MG/1.5ML SOAJ Inject 225 mg into the skin every 30 (thirty) days. 1.68 mL 5   phenytoin  (DILANTIN ) 100 MG ER capsule Take 2 capsules (200 mg total) by mouth 2 (two) times daily. 360 capsule 3   No facility-administered medications prior to visit.    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   TUBAL LIGATION      FAMILY HISTORY: Family History  Problem Relation Age of Onset   Breast cancer Neg Hx     SOCIAL HISTORY: Social History   Socioeconomic History   Marital status: Married    Spouse name: Not on file   Number of children: Not on file   Years of education: Not on file   Highest education level: Not on file  Occupational History   Not on file  Tobacco Use   Smoking status: Never   Smokeless tobacco: Never  Substance and Sexual  Activity   Alcohol use: No   Drug use: No   Sexual activity: Not on file  Other Topics Concern   Not on file  Social History Narrative   Not on file   Social Drivers of Health   Financial Resource Strain: Not on file  Food Insecurity: Low Risk  (08/09/2023)   Received from [facility]   Hunger Vital Sign    Within the past 12 months, you worried that your food would run out before you got money to buy more: Never true    Within the past 12 months, the food you bought just didn't last and you didn't have money to get more. : Never true  Transportation Needs: No Transportation Needs (08/09/2023)   Received from [facility]  Transportation    In the past 12 months, has lack of reliable transportation kept you from medical appointments, meetings, work or from getting things needed for daily living? : No  Physical Activity: Not on file  Stress: Not on file  Social Connections: Not on file  Intimate Partner Violence: Not on file   PHYSICAL EXAM  Vitals:   12/17/23 1017  BP: 120/81  Pulse: 73  Resp: 17  SpO2: 99%  Weight: 197 lb (89.4 kg)  Height: 5' 3.75 (1.619 m)    Body mass index is 34.08 kg/m.  Generalized: Well developed, in no acute distress  Neurological examination  Mentation: Alert oriented to time, place, history taking. Follows all commands speech and language fluent Cranial nerve II-XII: Pupils were equal round reactive to light. Extraocular movements were full, visual field were full on confrontational test. Facial sensation and strength were normal. Head turning and shoulder shrug  were normal and symmetric. Motor: The motor testing reveals 5 over 5 strength of all 4 extremities. Good symmetric motor tone is noted throughout.  Sensory: Sensory testing is intact to soft touch on all 4 extremities. No evidence of extinction is noted.  Coordination: Cerebellar testing reveals good finger-nose-finger and heel-to-shin bilaterally.  Gait and station: Gait is  normal.  Reflexes: Deep tendon reflexes are symmetric and normal bilaterally.   DIAGNOSTIC DATA (LABS, IMAGING, TESTING) - I reviewed patient records, labs, notes, testing and imaging myself where available.  Lab Results  Component Value Date   WBC 10.7 04/05/2022   HGB 14.9 04/05/2022   HCT 43.7 04/05/2022   MCV 86 04/05/2022   PLT 238 04/05/2022      Component Value Date/Time   NA 138 04/05/2022 1449   K 4.0 04/05/2022 1449   CL 99 04/05/2022 1449   CO2 25 04/05/2022 1449   GLUCOSE 100 (H) 04/05/2022 1449   GLUCOSE 123 (H) 10/18/2011 1410   BUN 11 04/05/2022 1449   CREATININE 0.91 04/05/2022 1449   CALCIUM 9.2 04/05/2022 1449   PROT 6.9 04/05/2022 1449   ALBUMIN 4.4 04/05/2022 1449   AST 17 04/05/2022 1449   ALT 23 04/05/2022 1449   ALKPHOS 60 04/05/2022 1449   BILITOT 0.3 04/05/2022 1449   GFRNONAA 86 12/14/2019 0845   GFRAA 99 12/14/2019 0845   No results found for: CHOL, HDL, LDLCALC, LDLDIRECT, TRIG, CHOLHDL No results found for: YHAJ8R No results found for: VITAMINB12 No results found for: TSH  #4 Boxes of Nurtec [id number] 07/2024 #2 Boxes Emgality  [id number] 06/18/2023  Pastor Falling MD  12/17/2023, 10:45 AM [facility] [address] [place], [place] [address] [phone]

## 2023-12-17 NOTE — Patient Instructions (Signed)
 Continue levetiracetam  750 mg twice daily for now Started lamotrigine  25 mg daily for 1 week Then increase to 25 mg twice daily for 1 week Then increase to 50 mg twice daily for 1 week  Then increase to 75 mg twice daily for 1 week Then further increase to 100 mg twice daily When you have reached 100 mg twice daily, please decrease levetiracetam  to 750 mg daily for 2 weeks then further decrease to half a tablet (375 mg) daily for 2 weeks then stop the medication. Please return to the office in 2 months for routine EEG and lamotrigine  level Please call for any additional questions or concerns. Return in 1 year or sooner if worse.

## 2024-02-11 ENCOUNTER — Ambulatory Visit: Admitting: Neurology

## 2024-02-11 DIAGNOSIS — G40309 Generalized idiopathic epilepsy and epileptic syndromes, not intractable, without status epilepticus: Secondary | ICD-10-CM | POA: Diagnosis not present

## 2024-02-12 ENCOUNTER — Ambulatory Visit: Payer: Self-pay | Admitting: Neurology

## 2024-02-12 NOTE — Procedures (Signed)
    History:  48 year old woman with epilepsy   EEG classification: Awake and drowsy  Duration: 28 minutes   Technical aspects: This EEG study was done with scalp electrodes positioned according to the 10-20 International system of electrode placement. Electrical activity was reviewed with band pass filter of 1-70Hz , sensitivity of 7 uV/mm, display speed of 29mm/sec with a 60Hz  notched filter applied as appropriate. EEG data were recorded continuously and digitally stored.   Description of the recording: The background rhythms of this recording consists of a fairly well modulated medium amplitude alpha rhythm of 12 Hz that is reactive to eye opening and closure. Present in the anterior head region is a 15-20 Hz beta activity. Photic stimulation was performed, did not show any abnormalities. Hyperventilation was also performed, did not show any abnormalities. Drowsiness was manifested by background fragmentation. No abnormal epileptiform discharges seen during this recording. There was no focal slowing. There was excess beta activity. There were no electrographic seizure identified.   Abnormality: None   Impression: This is a normal awake and drowsy EEG. No evidence of interictal epileptiform discharges. The excess beta activity is a benign pattern and likely associated with medication. Normal EEGs, however, do not rule out epilepsy.    Caydn Justen, MD [facility]

## 2024-02-14 ENCOUNTER — Encounter: Payer: Self-pay | Admitting: Neurology

## 2024-02-17 ENCOUNTER — Other Ambulatory Visit (HOSPITAL_COMMUNITY): Payer: Self-pay

## 2024-02-17 ENCOUNTER — Telehealth: Payer: Self-pay

## 2024-02-17 MED ORDER — LAMOTRIGINE 100 MG PO TABS: 100.0000 mg | ORAL_TABLET | Freq: Two times a day (BID) | ORAL | 3 refills | Status: AC

## 2024-02-17 NOTE — Telephone Encounter (Signed)
 Pharmacy Patient Advocate Encounter  Received notification from [employer] that Prior Authorization for Ajovy  has been APPROVED from 02/17/2024 to 02/16/2026. Ran test claim, Copay is $24.98. This test claim was processed through [facility]- copay amounts may vary at other pharmacies due to pharmacy/plan contracts, or as the patient moves through the different stages of their insurance plan.   PA #/Case ID/Reference #: [id number]  KEY: [id number]   I sent PT a MyChart message to let them know it has been approved.

## 2024-02-17 NOTE — Telephone Encounter (Signed)
 Ajovy  pa requested

## 2024-03-02 ENCOUNTER — Other Ambulatory Visit (HOSPITAL_COMMUNITY): Payer: Self-pay

## 2024-03-02 ENCOUNTER — Telehealth: Payer: Self-pay

## 2024-03-02 NOTE — Telephone Encounter (Signed)
 Pharmacy Patient Advocate Encounter   Received notification from CoverMyMeds that prior authorization for Nurtec is required/requested.   Insurance verification completed.   The patient is insured through [facility].   Per test claim: PA required; PA submitted to above mentioned insurance via Latent Key/confirmation #/EOC [id number] Status is pending

## 2024-03-03 NOTE — Telephone Encounter (Signed)
 Pharmacy Patient Advocate Encounter  Received notification from [employer] that Prior Authorization for Nurtec has been APPROVED from 03/02/2024 to 03/02/2026   PA #/Case ID/Reference #: [id number]

## 2024-06-12 ENCOUNTER — Other Ambulatory Visit: Payer: Self-pay | Admitting: Neurology

## 2024-06-15 NOTE — Telephone Encounter (Signed)
 Can someone please do PA for pt's Ajovy 

## 2024-06-16 ENCOUNTER — Encounter: Payer: Self-pay | Admitting: Neurology

## 2024-06-17 ENCOUNTER — Other Ambulatory Visit: Payer: Self-pay | Admitting: Neurology

## 2024-06-17 ENCOUNTER — Other Ambulatory Visit (HOSPITAL_COMMUNITY): Payer: Self-pay

## 2024-06-17 ENCOUNTER — Telehealth: Payer: Self-pay

## 2024-06-17 NOTE — Telephone Encounter (Signed)
 PA request has been Submitted. New Encounter has been or will be created for follow up. For additional info see Pharmacy Prior Auth telephone encounter from 06-17-2024.

## 2024-06-17 NOTE — Telephone Encounter (Signed)
 SABRA

## 2024-06-17 NOTE — Telephone Encounter (Signed)
 Pharmacy Patient Advocate Encounter   Received notification from RX Request Messages that prior authorization for AJOVY  (fremanezumab -vfrm) injection 225MG /1.5ML syringes is required/requested.   Insurance verification completed.   The patient is insured through [facility].   Per test claim: PA required; PA submitted to above mentioned insurance via Latent Key/confirmation #/EOC [id number] Status is pending

## 2024-06-22 NOTE — Telephone Encounter (Signed)
 Pharmacy Patient Advocate Encounter  Received notification from [employer] that Prior Authorization for AJOVY  (fremanezumab -vfrm) injection 225MG /1.5ML syringes has been DENIED.  Full denial letter will be uploaded to the media tab. See denial reason below.  Per your health plan's criteria, this drug is covered if you meet the following:  (1) One of the following:  (A) All of the following:  (I) Your doctor provides medical records (for example: chart notes) showing that you have a type of headache condition (episodic migraines) (II) Your doctor provides medical records (for example: chart notes) showing that you have more than or equal to 4 migraine days per mont. (B) All of the following:  (I) Your doctor provides medical records (for example: chart notes) showing that you have a type of headache condition (chronic migraines) (II) Medication overuse headache has been considered and you have stopped drugs that can possibly cause this type of headache (potentially offending drugs have been discontinued) (III) Your doctor provides medical records (for example: chart notes) showing that you have more than or equal to 8 migraine days per month (2) There are paid claims or your doctor provides medical records (for example: chart noes) showing you have failed (after at least a two month trial) or cannot use one of the following preventative treatments for migraine (from different mechanisms of action):  (A) Elavil (amitriptyline) or Effexor (venlafaxine) (B) Depakote/Depakote ER (divalproex sodiums) or Topamax  (topiramate ) (C) Candesartan (D) Lisinopril (3) There are paid claims or your doctor provides medical records (for example: chart notes) showing that you have failed (after at least 12-week trial) or cannot use both of the following: Aimovig and Emgality  120mg /mL  PA #/Case ID/Reference #: [id number]

## 2024-06-23 ENCOUNTER — Other Ambulatory Visit: Payer: Self-pay | Admitting: Neurology

## 2024-06-23 MED ORDER — EMGALITY 120 MG/ML ~~LOC~~ SOSY: 120.0000 mg | PREFILLED_SYRINGE | SUBCUTANEOUS | 11 refills | Status: AC

## 2024-06-23 MED ORDER — EMGALITY 120 MG/ML ~~LOC~~ SOSY: 240.0000 mg | PREFILLED_SYRINGE | Freq: Once | SUBCUTANEOUS | 0 refills | Status: AC

## 2024-06-23 NOTE — Telephone Encounter (Signed)
 That is a very detailed denial letter. My interpretation is that she will have to try Aimovig or Emgality . I would rather Emaglity, since Aimovig can have constipation side effect. I ordered Emgality  in 2023 but I don't think she ever tried it because insurance preferred Ajovy  at the time, hence she has been very well maintained on Ajovy  since. I have sent her a my chart message. Thanks

## 2024-06-23 NOTE — Addendum Note (Signed)
 Addended by: GAYLAND LAURAINE PARAS on: 06/23/2024 04:45 PM   Modules accepted: Orders

## 2024-06-24 ENCOUNTER — Telehealth: Payer: Self-pay

## 2024-06-24 ENCOUNTER — Other Ambulatory Visit (HOSPITAL_COMMUNITY): Payer: Self-pay

## 2024-06-24 NOTE — Telephone Encounter (Signed)
 PA request has been Submitted. New Encounter has been or will be created for follow up. For additional info see Pharmacy Prior Auth telephone encounter from 06-24-2024.

## 2024-06-24 NOTE — Telephone Encounter (Signed)
 Pharmacy Patient Advocate Encounter   Received notification from Patient Advice Request messages that prior authorization for Emgality  120MG /ML syringes (migraine) is required/requested.   Insurance verification completed.   The patient is insured through [facility].   Per test claim: PA required; PA submitted to above mentioned insurance via Latent Key/confirmation #/EOC [id number] Status is pending

## 2024-12-17 ENCOUNTER — Ambulatory Visit: Admitting: Neurology
